# Patient Record
Sex: Male | Born: 1955 | Race: White | Hispanic: No | Smoking: Former smoker
Health system: Southern US, Community
[De-identification: ages and names within clinical notes are randomized; demographics above are authoritative.]

## PROBLEM LIST (undated history)

## (undated) DIAGNOSIS — Z9989 Dependence on other enabling machines and devices: Secondary | ICD-10-CM

## (undated) DIAGNOSIS — F419 Anxiety disorder, unspecified: Secondary | ICD-10-CM

## (undated) DIAGNOSIS — F329 Major depressive disorder, single episode, unspecified: Secondary | ICD-10-CM

## (undated) DIAGNOSIS — E785 Hyperlipidemia, unspecified: Secondary | ICD-10-CM

## (undated) DIAGNOSIS — Z860101 Personal history of adenomatous and serrated colon polyps: Secondary | ICD-10-CM

## (undated) DIAGNOSIS — G4700 Insomnia, unspecified: Secondary | ICD-10-CM

## (undated) DIAGNOSIS — N182 Chronic kidney disease, stage 2 (mild): Secondary | ICD-10-CM

## (undated) DIAGNOSIS — J449 Chronic obstructive pulmonary disease, unspecified: Secondary | ICD-10-CM

## (undated) DIAGNOSIS — G44229 Chronic tension-type headache, not intractable: Secondary | ICD-10-CM

## (undated) DIAGNOSIS — F1011 Alcohol abuse, in remission: Secondary | ICD-10-CM

## (undated) DIAGNOSIS — M1990 Unspecified osteoarthritis, unspecified site: Secondary | ICD-10-CM

## (undated) DIAGNOSIS — Z86010 Personal history of colonic polyps: Secondary | ICD-10-CM

## (undated) DIAGNOSIS — I10 Essential (primary) hypertension: Secondary | ICD-10-CM

## (undated) DIAGNOSIS — N529 Male erectile dysfunction, unspecified: Secondary | ICD-10-CM

## (undated) DIAGNOSIS — G4733 Obstructive sleep apnea (adult) (pediatric): Secondary | ICD-10-CM

## (undated) DIAGNOSIS — Z8669 Personal history of other diseases of the nervous system and sense organs: Secondary | ICD-10-CM

## (undated) DIAGNOSIS — I5032 Chronic diastolic (congestive) heart failure: Principal | ICD-10-CM

## (undated) DIAGNOSIS — D751 Secondary polycythemia: Secondary | ICD-10-CM

## (undated) HISTORY — PX: UVULECTOMY: SHX2631

## (undated) HISTORY — DX: Personal history of colonic polyps: Z86.010

## (undated) HISTORY — PX: TONSILLECTOMY/ADENOIDECTOMY/TURBINATE REDUCTION: SHX6126

## (undated) HISTORY — DX: Essential (primary) hypertension: I10

## (undated) HISTORY — DX: Chronic obstructive pulmonary disease, unspecified: J44.9

## (undated) HISTORY — DX: Male erectile dysfunction, unspecified: N52.9

## (undated) HISTORY — PX: JOINT REPLACEMENT: SHX530

## (undated) HISTORY — DX: Hyperlipidemia, unspecified: E78.5

## (undated) HISTORY — DX: Personal history of other diseases of the nervous system and sense organs: Z86.69

## (undated) HISTORY — DX: Obstructive sleep apnea (adult) (pediatric): G47.33

## (undated) HISTORY — PX: FRACTURE SURGERY: SHX138

## (undated) HISTORY — DX: Anxiety disorder, unspecified: F41.9

## (undated) HISTORY — DX: Personal history of adenomatous and serrated colon polyps: Z86.0101

## (undated) HISTORY — PX: TOE SURGERY: SHX1073

## (undated) HISTORY — DX: Alcohol abuse, in remission: F10.11

## (undated) HISTORY — DX: Chronic kidney disease, stage 2 (mild): N18.2

## (undated) HISTORY — PX: FINGER SURGERY: SHX640

## (undated) HISTORY — DX: Chronic diastolic (congestive) heart failure: I50.32

## (undated) HISTORY — DX: Unspecified osteoarthritis, unspecified site: M19.90

## (undated) HISTORY — DX: Secondary polycythemia: D75.1

## (undated) HISTORY — DX: Chronic tension-type headache, not intractable: G44.229

## (undated) HISTORY — PX: COLONOSCOPY W/ POLYPECTOMY: SHX1380

## (undated) HISTORY — DX: Dependence on other enabling machines and devices: Z99.89

---

## 1997-12-10 ENCOUNTER — Ambulatory Visit: Admission: RE | Admit: 1997-12-10 | Discharge: 1997-12-10 | Payer: Self-pay | Admitting: Otolaryngology

## 1998-06-02 ENCOUNTER — Encounter: Admission: RE | Admit: 1998-06-02 | Discharge: 1998-08-31 | Payer: Self-pay | Admitting: *Deleted

## 1999-08-05 ENCOUNTER — Encounter: Admission: RE | Admit: 1999-08-05 | Discharge: 1999-08-05 | Payer: Self-pay | Admitting: *Deleted

## 1999-11-23 ENCOUNTER — Encounter: Admission: RE | Admit: 1999-11-23 | Discharge: 2000-02-21 | Payer: Self-pay | Admitting: *Deleted

## 2000-01-10 ENCOUNTER — Ambulatory Visit (HOSPITAL_BASED_OUTPATIENT_CLINIC_OR_DEPARTMENT_OTHER): Admission: RE | Admit: 2000-01-10 | Discharge: 2000-01-10 | Payer: Self-pay | Admitting: Podiatry

## 2000-11-07 ENCOUNTER — Encounter: Admission: RE | Admit: 2000-11-07 | Discharge: 2000-11-07 | Payer: Self-pay | Admitting: Family Medicine

## 2000-11-07 ENCOUNTER — Encounter: Payer: Self-pay | Admitting: Family Medicine

## 2000-11-15 ENCOUNTER — Encounter: Admission: RE | Admit: 2000-11-15 | Discharge: 2000-11-15 | Payer: Self-pay | Admitting: *Deleted

## 2000-11-15 ENCOUNTER — Encounter: Payer: Self-pay | Admitting: *Deleted

## 2002-08-05 ENCOUNTER — Ambulatory Visit (HOSPITAL_BASED_OUTPATIENT_CLINIC_OR_DEPARTMENT_OTHER): Admission: RE | Admit: 2002-08-05 | Discharge: 2002-08-05 | Payer: Self-pay

## 2004-05-23 ENCOUNTER — Emergency Department (HOSPITAL_COMMUNITY): Admission: EM | Admit: 2004-05-23 | Discharge: 2004-05-23 | Payer: Self-pay | Admitting: Emergency Medicine

## 2004-06-20 ENCOUNTER — Ambulatory Visit (HOSPITAL_COMMUNITY): Admission: RE | Admit: 2004-06-20 | Discharge: 2004-06-20 | Payer: Self-pay | Admitting: Gastroenterology

## 2009-08-28 HISTORY — PX: ROTATOR CUFF REPAIR: SHX139

## 2012-05-06 ENCOUNTER — Ambulatory Visit (INDEPENDENT_AMBULATORY_CARE_PROVIDER_SITE_OTHER): Payer: 59 | Admitting: Family Medicine

## 2012-05-06 VITALS — BP 118/69 | HR 65 | Temp 98.0°F | Resp 18 | Ht 71.5 in | Wt 269.0 lb

## 2012-05-06 DIAGNOSIS — T148XXA Other injury of unspecified body region, initial encounter: Secondary | ICD-10-CM

## 2012-05-06 DIAGNOSIS — T07XXXA Unspecified multiple injuries, initial encounter: Secondary | ICD-10-CM

## 2012-05-06 DIAGNOSIS — L02619 Cutaneous abscess of unspecified foot: Secondary | ICD-10-CM

## 2012-05-06 DIAGNOSIS — L03039 Cellulitis of unspecified toe: Secondary | ICD-10-CM

## 2012-05-06 MED ORDER — DOXYCYCLINE HYCLATE 100 MG PO TABS: 100.0000 mg | ORAL_TABLET | Freq: Two times a day (BID) | ORAL | Status: AC

## 2012-05-06 MED ORDER — SILVER SULFADIAZINE 1 % EX CREA: TOPICAL_CREAM | Freq: Two times a day (BID) | CUTANEOUS | Status: DC

## 2012-05-06 NOTE — Progress Notes (Signed)
[facility]:  Office Visit  Chief Complaint:  Chief Complaint  Patient presents with  . Toe Pain    blisters on toes of left foot    HPI: Peter Briggs is a 56 y.o. male who complains of  Left foot with ulcers on great and 4th toes. Wears work shoes, been on hard floors, 3-4 days ago had blisters, now they "popped and hurt". Has tried OTC abx ointment on it. No improvement. Denies any h/o diabetes. No prior rashes, fevers, chills, insect bites  Past Medical History  Diagnosis Date  . Sleep apnea     on CPAP  . Hypertension   . Hyperlipidemia   . Erectile dysfunction    Past Surgical History  Procedure Date  . Finger surgery   . Toe surgery    History   Social History  . Marital Status: Married    Spouse Name: N/A    Number of Children: N/A  . Years of Education: N/A   Social History Main Topics  . Smoking status: Passive Smoker    Types: Cigars  . Smokeless tobacco: None  . Alcohol Use: Yes  . Drug Use: No  . Sexually Active: None   Other Topics Concern  . None   Social History Narrative  . None   No family history on file. No Known Allergies Prior to Admission medications   Medication Sig Start Date End Date Taking? Authorizing Provider  aspirin 81 MG tablet Take 81 mg by mouth daily.   Yes Historical Provider, MD  atenolol-chlorthalidone (TENORETIC) 100-25 MG per tablet Take 1 tablet by mouth daily.   Yes Historical Provider, MD  lisinopril (PRINIVIL,ZESTRIL) 20 MG tablet Take 20 mg by mouth daily.   Yes Historical Provider, MD  Niacin-Simvastatin [facility]) 1000-40 MG TB24 Take by mouth daily.   Yes Historical Provider, MD  tadalafil (CIALIS) 5 MG tablet Take by mouth daily as needed. Not sure of dosage.   Yes Historical Provider, MD     ROS: The patient denies fevers, chills, night sweats, unintentional weight loss, chest pain, palpitations, wheezing, dyspnea on exertion, nausea, vomiting, abdominal pain, dysuria, hematuria,  melena, numbness, weakness, or tingling.   All other systems have been reviewed and were otherwise negative with the exception of those mentioned in the HPI and as above.    PHYSICAL EXAM: Filed Vitals:   05/06/12 0924  BP: 118/69  Pulse: 65  Temp: 98 F (36.7 C)  Resp: 18   Filed Vitals:   05/06/12 0924  Height: 5' 11.5" (1.816 m)  Weight: 269 lb (122.018 kg)   Body mass index is 37.00 kg/(m^2).  General: Alert, no acute distress HEENT:  Normocephalic, atraumatic, oropharynx patent.  Cardiovascular:  Regular rate and rhythm, no rubs murmurs or gallops.  No Carotid bruits, radial pulse intact. No pedal edema.  Respiratory: Clear to auscultation bilaterally.  No wheezes, rales, or rhonchi.  No cyanosis, no use of accessory musculature GI: No organomegaly, abdomen is soft and non-tender, positive bowel sounds.  No masses. Skin: LEft foot big toe and 4th toe blisters s/p burst. Now good erytheamtous granualtion tissue. NO dc. Milderly tender. + warmth, + redness around 4th toe.  Neurologic: Facial musculature symmetric. Psychiatric: Patient is appropriate throughout our interaction. Lymphatic: No cervical lymphadenopathy Musculoskeletal: Gait intact.   LABS: No results found for this or any previous visit.   EKG/XRAY:   Primary read interpreted by Dr. Conley Rolls at [facility].   ASSESSMENT/PLAN: Encounter Diagnoses  Name  Primary?  . Cellulitis, toe Yes  . Multiple wounds    Rx Doxycycline for possible cellulitis Rx Silvidene for comfort and local antimicrobial Wound care with soap and water BID, Telfa and coban F/u in 1 week Return prn if worse Note given for work excuse 05/07/12 only    Shelisha Gautier PHUONG, DO 05/06/2012 10:03 AM

## 2012-05-28 DIAGNOSIS — F32A Depression, unspecified: Secondary | ICD-10-CM

## 2012-05-28 HISTORY — DX: Depression, unspecified: F32.A

## 2012-06-08 ENCOUNTER — Emergency Department (HOSPITAL_COMMUNITY): Payer: 59

## 2012-06-08 ENCOUNTER — Encounter (HOSPITAL_COMMUNITY): Payer: Self-pay | Admitting: *Deleted

## 2012-06-08 ENCOUNTER — Observation Stay (HOSPITAL_COMMUNITY)
Admission: EM | Admit: 2012-06-08 | Discharge: 2012-06-10 | DRG: 309 | Disposition: A | Discharge status: Home / Self Care | Payer: 59 | Attending: Internal Medicine | Admitting: Internal Medicine

## 2012-06-08 ENCOUNTER — Ambulatory Visit (INDEPENDENT_AMBULATORY_CARE_PROVIDER_SITE_OTHER): Payer: 59 | Admitting: Family Medicine

## 2012-06-08 VITALS — BP 103/70 | HR 53 | Temp 97.9°F | Resp 14 | Ht 72.5 in | Wt 259.0 lb

## 2012-06-08 DIAGNOSIS — R42 Dizziness and giddiness: Secondary | ICD-10-CM | POA: Insufficient documentation

## 2012-06-08 DIAGNOSIS — Z7982 Long term (current) use of aspirin: Secondary | ICD-10-CM | POA: Insufficient documentation

## 2012-06-08 DIAGNOSIS — E669 Obesity, unspecified: Secondary | ICD-10-CM | POA: Diagnosis present

## 2012-06-08 DIAGNOSIS — R630 Anorexia: Secondary | ICD-10-CM | POA: Insufficient documentation

## 2012-06-08 DIAGNOSIS — F419 Anxiety disorder, unspecified: Secondary | ICD-10-CM | POA: Diagnosis present

## 2012-06-08 DIAGNOSIS — N183 Chronic kidney disease, stage 3 unspecified: Secondary | ICD-10-CM | POA: Diagnosis present

## 2012-06-08 DIAGNOSIS — R209 Unspecified disturbances of skin sensation: Secondary | ICD-10-CM

## 2012-06-08 DIAGNOSIS — Z7902 Long term (current) use of antithrombotics/antiplatelets: Secondary | ICD-10-CM | POA: Insufficient documentation

## 2012-06-08 DIAGNOSIS — G4733 Obstructive sleep apnea (adult) (pediatric): Secondary | ICD-10-CM | POA: Insufficient documentation

## 2012-06-08 DIAGNOSIS — R5381 Other malaise: Secondary | ICD-10-CM | POA: Insufficient documentation

## 2012-06-08 DIAGNOSIS — R45851 Suicidal ideations: Secondary | ICD-10-CM | POA: Insufficient documentation

## 2012-06-08 DIAGNOSIS — F341 Dysthymic disorder: Secondary | ICD-10-CM | POA: Insufficient documentation

## 2012-06-08 DIAGNOSIS — R110 Nausea: Secondary | ICD-10-CM | POA: Insufficient documentation

## 2012-06-08 DIAGNOSIS — I10 Essential (primary) hypertension: Secondary | ICD-10-CM | POA: Diagnosis present

## 2012-06-08 DIAGNOSIS — R200 Anesthesia of skin: Secondary | ICD-10-CM

## 2012-06-08 DIAGNOSIS — E785 Hyperlipidemia, unspecified: Secondary | ICD-10-CM | POA: Insufficient documentation

## 2012-06-08 DIAGNOSIS — R001 Bradycardia, unspecified: Secondary | ICD-10-CM

## 2012-06-08 DIAGNOSIS — I442 Atrioventricular block, complete: Secondary | ICD-10-CM

## 2012-06-08 DIAGNOSIS — I498 Other specified cardiac arrhythmias: Secondary | ICD-10-CM

## 2012-06-08 DIAGNOSIS — I129 Hypertensive chronic kidney disease with stage 1 through stage 4 chronic kidney disease, or unspecified chronic kidney disease: Secondary | ICD-10-CM | POA: Insufficient documentation

## 2012-06-08 DIAGNOSIS — E860 Dehydration: Secondary | ICD-10-CM | POA: Insufficient documentation

## 2012-06-08 DIAGNOSIS — I959 Hypotension, unspecified: Secondary | ICD-10-CM

## 2012-06-08 DIAGNOSIS — G4700 Insomnia, unspecified: Secondary | ICD-10-CM | POA: Diagnosis present

## 2012-06-08 DIAGNOSIS — R5383 Other fatigue: Secondary | ICD-10-CM | POA: Diagnosis present

## 2012-06-08 LAB — POCT CBC
Granulocyte percent: 73.5 %G (ref 37–80)
HCT, POC: 54.5 % — AB (ref 43.5–53.7)
MID (cbc): 0.9 (ref 0–0.9)
MPV: 8.9 fL (ref 0–99.8)
POC Granulocyte: 9.2 — AB (ref 2–6.9)
POC LYMPH PERCENT: 18.9 %L (ref 10–50)
POC MID %: 7.6 %M (ref 0–12)
Platelet Count, POC: 322 10*3/uL (ref 142–424)
RDW, POC: 14.5 %

## 2012-06-08 LAB — COMPREHENSIVE METABOLIC PANEL
ALT: 20 U/L (ref 0–53)
AST: 17 U/L (ref 0–37)
Alkaline Phosphatase: 48 U/L (ref 39–117)
Creat: 1.59 mg/dL — ABNORMAL HIGH (ref 0.50–1.35)
Total Bilirubin: 0.7 mg/dL (ref 0.3–1.2)

## 2012-06-08 LAB — GLUCOSE, POCT (MANUAL RESULT ENTRY): POC Glucose: 89 mg/dl (ref 70–99)

## 2012-06-08 LAB — CBC WITH DIFFERENTIAL/PLATELET
Basophils Absolute: 0 10*3/uL (ref 0.0–0.1)
Basophils Relative: 0 % (ref 0–1)
Eosinophils Absolute: 0.5 10*3/uL (ref 0.0–0.7)
Eosinophils Relative: 4 % (ref 0–5)
HCT: 45.2 % (ref 39.0–52.0)
MCH: 32.4 pg (ref 26.0–34.0)
MCHC: 36.7 g/dL — ABNORMAL HIGH (ref 30.0–36.0)
MCV: 88.1 fL (ref 78.0–100.0)
Monocytes Absolute: 1 10*3/uL (ref 0.1–1.0)
Monocytes Relative: 9 % (ref 3–12)
Neutro Abs: 7.4 10*3/uL (ref 1.7–7.7)
RDW: 13.6 % (ref 11.5–15.5)

## 2012-06-08 LAB — POCT SEDIMENTATION RATE: POCT SED RATE: 3 mm/hr (ref 0–22)

## 2012-06-08 NOTE — ED Notes (Signed)
EKG given to Dr Aubery Lapping. No old EKG.

## 2012-06-08 NOTE — Progress Notes (Signed)
  Subjective:    Patient ID: Peter Briggs, male    DOB: 11-10-1955, 56 y.o.   MRN: [id number] Chief Complaint  Patient presents with  . bilateral arm pain/tingling/numbness    since A.M lots of stress recently rx'd ambien to help with sleep    HPI  Feeling terrible for past 1 1/2 wks.  A lot of personal stress and couldn't sleep so called dr who prescribed ambien but it didn't help so would sleep just about 1-2hrs intermittently for the next 4d. Had to call in Mon Tues, Wed and couldn't go into work due to [employer] so rx'ed [employer] which helps for a few hrs but still can't get back to sleep.  Then last night awoke w/ L arm pain and severe HA took acetaminophen, most acute pain in L thumb and both arms tingling.  Called dr who told him to stop ambien and tingling in Rt arm but dull ache in thumb to shoulder and tingling. Has had pain in it before but never done this, No neck problems, no CP, HA continues, very weak, dizzy - lightheadedness, just in just horrible. No nausea, no SHoB, no sweats,  No weakness in arms Right handed.  No f/c, tol po well, eating and drinking well. No gi/gu prob. Did take bp pills today.    Review of Systems    BP 103/70  Pulse 53  Temp(Src) 97.9 F (36.6 C) (Oral)  Resp 14  Ht 6' 0.5" (1.842 m)  Wt 259 lb (117.482 kg)  BMI 34.63 kg/m2  SpO2 98% Objective:   Physical Exam        Assessment & Plan:  Numbness and tingling in left arm - Plan: POCT CBC, POCT SEDIMENTATION RATE, POCT glucose (manual entry), Comprehensive metabolic panel, EKG 12-Lead, EKG 12-Lead  Third degree heart block  Hypotension  Bradycardia  Meds ordered this encounter  Medications  . zolpidem (AMBIEN CR) 12.5 MG CR tablet    Sig: Take 12.5 mg by mouth at bedtime as needed. For sleep

## 2012-06-08 NOTE — H&P (Signed)
PCP:   Kari Baars, MD   Chief Complaint:  Weak, tired, L arm/body pain  HPI: Patient is a 56 year old male who called me earlier in the day, or actually his mother called me earlier with a littany of issues.  These included feeling tired as he had recently started on Ambien CR for chronic insomnia, L arm pain, which he later told me was numbness in both arms, and just not feeling well.  None of his issues sounded acute, but I offered for him to be seen [facility]  He went and they found him to be bradycardic and hypotensive.  Of note he is on Atenolol/HCTZ as a BP med.  He did not note having taken more meds than he should and indicated that he had not mixed them with alcohol or other sedatives.  He was given 2 liters of fluid in the ER, his HR rose to the low 50's, not looking acute for MI (no enzymes were ordered during ED evaluation).  As he was not returning to baseline, required admission.  General:       Fatigue, tired, "doesn't feel well".   Eyes:       Denies vision loss.   Ears/Nose/Throat:       Denies decreased hearing.   Cardiovascular:       Denies chest pains, dyspnea on exertion, peripheral edema. Notes L arm numbness more than pain.  Respiratory:       Denies dyspnea.   Gastrointestinal:       Denies diarrhea, constipation, heartburn.   Genitourinary:       Complains of see HPI.        Denies urinary frequency.   Musculoskeletal:       Denies joint pain.   Skin:       Denies suspicious lesions.   Psychiatric:       Denies depression, anxiety, but "has a lot going on" and will not be specific   Endocrine:       Denies polydipsia, polyuria.   Heme/Lymphatic:       Denies bleeding.    Past History Past Medical History (reviewed - no changes required): HTN Dyslipidemia OSA on CPAP colon polyps (2005) diverticulitis Obesity chronic renal insuffeciency, Stage 3 (GFR 52) abnml lft ,mild Surgical History: toe surgery right shoulder surgery (8/12) Family  History (reviewed - no changes required): Father- deceased brain tumor, renal failure Mother- healthy, colon polyps PGF- COPD, PGM- Accident PGF- 1/2 bro- pain, fatigue ?cause; history of substance abuse 8 children- asthma in 2 Social History: separated recently, in a new relationship 8 children, 10 grandchildren (2 more on the way) 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 history of ETOH abuse, occasional cigars   Medications: Prior to Admission medications   Medication Sig Start Date End Date Taking? Authorizing Provider  aspirin 81 MG tablet Take 81 mg by mouth daily.   Yes Historical Provider, MD  atenolol-chlorthalidone (TENORETIC) 100-25 MG per tablet Take 1 tablet by mouth daily.   Yes Historical Provider, MD  lisinopril (PRINIVIL,ZESTRIL) 20 MG tablet Take 20 mg by mouth daily.   Yes Historical Provider, MD  Naphazoline-Pheniramine (EYE ALLERGY RELIEF OP) Apply 1 drop to eye daily as needed. For dry and itching eyes   Yes Historical Provider, MD  Niacin-Simvastatin [facility]) 1000-40 MG TB24 Take 1 tablet by mouth at bedtime.    Yes Historical Provider, MD  oxymetazoline (AFRIN) 0.05 % nasal spray Place 2 sprays  into the nose 2 (two) times daily.   Yes Historical Provider, MD  silver sulfADIAZINE (SILVADENE) 1 % cream Apply topically 2 (two) times daily. 05/06/12 05/06/13 Yes Thao P Le, DO  tadalafil (CIALIS) 5 MG tablet Take 5 mg by mouth daily as needed. Erectile dysfunction   Yes Historical Provider, MD  zolpidem (AMBIEN CR) 12.5 MG CR tablet Take 12.5 mg by mouth at bedtime as needed. For sleep   Yes Historical Provider, MD    Allergies:  No Known Allergies   Physical Exam: Filed Vitals:   06/08/12 2010 06/08/12 2015 06/08/12 2048 06/08/12 2100  BP:  104/48  116/60  Pulse: 50 54 56 53  Temp:      TempSrc:      Resp: 11 10 14 24   SpO2: 96% 97% 98% 97%   General appearance: alert, cooperative and appears stated age Head: Normocephalic, without  obvious abnormality, atraumatic Eyes: conjunctivae/corneas clear. PERRL, EOM's intact.  Nose: Nares normal. Septum midline. Mucosa normal. No drainage or sinus tenderness. Throat: lips, mucosa, and tongue normal; teeth and gums normal Neck: no adenopathy, no carotid bruit, no JVD and thyroid not enlarged, symmetric, no tenderness/mass/nodules Resp: clear to auscultation bilaterally Cardio: Regularly regular, but bradycardic GI: soft, non-tender; bowel sounds normal; no masses,  no organomegaly Extremities: extremities normal, atraumatic, no cyanosis or edema Pulses: 2+ and symmetric Lymph nodes: Cervical adenopathy: no cervical lymphadenopathy Neurologic: Alert and oriented X 3, normal strength and tone. Normal symmetric reflexes.     Labs on Admission:   [facility] 06/08/12 1717  NA 135  K 4.2  CL 101  CO2 24  GLUCOSE 90  BUN 37*  CREATININE 1.59*  CALCIUM 11.6*  MG --  PHOS --    Basename 06/08/12 1717  AST 17  ALT 20  ALKPHOS 48  BILITOT 0.7  PROT 7.2  ALBUMIN 4.5    Basename 06/08/12 1716  WBC 12.5*  NEUTROABS --  HGB 18.1  HCT 54.5*  MCV 96.8  PLT --    Radiological Exams on Admission: Dg Chest 2 View  06/08/2012  *RADIOLOGY REPORT*  Clinical Data: Chest pain and dizziness.  CHEST - 2 VIEW  Comparison: None.  Findings: The heart size is normal.  The lungs are clear.  The visualized soft tissues and bony thorax are unremarkable.  IMPRESSION: Negative chest.   Original Report Authenticated By: Jamesetta Orleans. MATTERN, M.D.    Orders placed during the hospital encounter of 06/08/12  . ED EKG  . ED EKG  . EKG 12-LEAD  . EKG 12-LEAD    Assessment/Plan 1) Generalized weakness-  Likely due to bradycardia, and is likely related to his Atenolol.  Will hold, keep on tele, and reevaluate in AM.  May consider echocardiogram if does not improve. 2) Dehydration/prerenal azotemia- IV fluids 3) Hypercalcemia- Will recheck post hydration and if remains high, will check  PTH and Vit D levels.  Prior level in office earlier this year was normal. 4) Insomnia- Given his recent sleepiness, I will not be resuming his Ambien until he starts to feel better 5) ? Depression- he is "under stress" as both he and his mom have said but will not specify.  Perhaps his regular primary care MD has more insight into this and he will see him on Monday. 6) Will check cardiac enzymes due to L arm pain and change in EKG, although not specific for ischemia. 7) Erythrocytosis-  Was also noted on APE labs earlier this year.  Will check post  hydration and if remains, consider workup for EPO level/Jak-2 vs related to some of his history of tobacco use and related to lung disease. 8)  Hypotension- Hold antihypertensive meds 9) Hyperlipidemia- Check Lipid panel in AM. 10) Sleep apnea-  His office chart does not contain settings and therefore cannot be ordered at this time.   Makana Feigel W 06/08/2012, 11:19 PM

## 2012-06-08 NOTE — ED Provider Notes (Signed)
History     CSN: [id number]  Arrival date & time 06/08/12  4098   First MD Initiated Contact with Patient 06/08/12 1944      Chief Complaint  Patient presents with  . Bradycardia    (Consider location/radiation/quality/duration/timing/severity/associated sxs/prior treatment) Patient is a 56 y.o. male presenting with weakness. The history is provided by the patient.  Weakness The primary symptoms include dizziness and nausea. Primary symptoms do not include headaches, syncope, loss of consciousness, altered mental status, seizures, visual change, paresthesias, focal weakness, loss of sensation, speech change, memory loss, fever or vomiting. The symptoms began 12 to 24 hours ago. The symptoms are improving. The neurological symptoms are diffuse. The symptoms occurred while sleeping.  Dizziness also occurs with nausea and weakness. Dizziness does not occur with vomiting.  Additional symptoms include weakness and pain (left arm). Associated symptoms comments: Decreased sleep for last 2 weeks, recently started on ambien. Medical issues also include hypertension (on atenolol).    Past Medical History  Diagnosis Date  . Sleep apnea     on CPAP  . Hypertension   . Hyperlipidemia   . Erectile dysfunction     Past Surgical History  Procedure Date  . Finger surgery   . Toe surgery     History reviewed. No pertinent family history.  History  Substance Use Topics  . Smoking status: Current Some Day Smoker    Types: Cigars  . Smokeless tobacco: Not on file  . Alcohol Use: Yes      Review of Systems  Constitutional: Positive for appetite change (decreased). Negative for fever.  Respiratory: Negative for cough and shortness of breath.   Cardiovascular: Negative for chest pain and syncope.  Gastrointestinal: Positive for nausea. Negative for vomiting, abdominal pain and diarrhea.  Neurological: Positive for dizziness and weakness. Negative for speech change, focal weakness,  seizures, loss of consciousness, headaches and paresthesias.  Psychiatric/Behavioral: Negative for memory loss and altered mental status.  All other systems reviewed and are negative.    Allergies  Review of patient's allergies indicates no known allergies.  Home Medications   Current Outpatient Rx  Name Route Sig Dispense Refill  . ASPIRIN 81 MG PO TABS Oral Take 81 mg by mouth daily.    . ATENOLOL-CHLORTHALIDONE 100-25 MG PO TABS Oral Take 1 tablet by mouth daily.    [facility] LISINOPRIL 20 MG PO TABS Oral Take 20 mg by mouth daily.    [facility] NIACIN-SIMVASTATIN ER 1000-40 MG PO TB24 Oral Take by mouth daily.    [facility] SILVER SULFADIAZINE 1 % EX CREA Topical Apply topically 2 (two) times daily. 50 g 0  . TADALAFIL 5 MG PO TABS Oral Take by mouth daily as needed. Not sure of dosage.    [facility] ZOLPIDEM TARTRATE ER 12.5 MG PO TBCR Oral Take 12.5 mg by mouth at bedtime as needed.      There were no vitals taken for this visit.  Physical Exam  Nursing note and vitals reviewed. Constitutional: He is oriented to person, place, and time. He appears well-developed and well-nourished. No distress.  HENT:  Head: Normocephalic and atraumatic.  Eyes: Pupils are equal, round, and reactive to light.  Cardiovascular: Normal heart sounds and intact distal pulses.  Bradycardia present.   Pulmonary/Chest: Effort normal and breath sounds normal. No respiratory distress.  Abdominal: Soft. He exhibits no distension. There is no tenderness.  Musculoskeletal: Normal range of motion.  Neurological: He is alert and oriented to person, place, and time.  Skin:  Skin is warm and dry.  Psychiatric: He has a normal mood and affect.    ED Course  Procedures (including critical care time)  Labs Reviewed  CBC WITH DIFFERENTIAL - Abnormal; Notable for the following:    WBC 11.3 (*)     MCHC 36.7 (*)     All other components within normal limits   Dg Chest 2 View  06/08/2012  *RADIOLOGY REPORT*  Clinical Data: Chest pain and  dizziness.  CHEST - 2 VIEW  Comparison: None.  Findings: The heart size is normal.  The lungs are clear.  The visualized soft tissues and bony thorax are unremarkable.  IMPRESSION: Negative chest.   Original Report Authenticated By: Jamesetta Orleans. MATTERN, M.D.     Date: 06/08/2012  Rate: 54  Rhythm: sinus bradycardia  QRS Axis: normal  Intervals: normal  ST/T Wave abnormalities: normal  Conduction Disutrbances:none  Narrative Interpretation: sinus bradycardia  Old EKG Reviewed: none available    1. Bradycardia       MDM  7:45 PM Pt seen and examined. Pt awoke overnight feeling unwell with pain going down left arm. Pt found to have sinus bradycardia on EKG and transferred here as he was hypotensive (which responded to fluids). No evidence of heart block. Concern for left arm pain being anginal equivalent. Will send troponin.  Troponin result normal. Will call [facility] for admission for ACS r/o.        Daleen Bo, MD 06/09/12 [phone]

## 2012-06-08 NOTE — ED Notes (Signed)
Pt in via EMS, per EMS- pt in from urgent care due to decreased heart rate and blood pressure. Pt was recently started on ambien and is unsure if he took an extra beta blocker. Pt c/o left arm tingling and a mild headache, denies chest pain. Denies N/V. Pt alert and oriented, skin warm and dry, no distress noted.

## 2012-06-08 NOTE — ED Notes (Signed)
Pt to radiology.

## 2012-06-09 ENCOUNTER — Encounter (HOSPITAL_COMMUNITY): Payer: Self-pay | Admitting: *Deleted

## 2012-06-09 LAB — CBC
HCT: 45.4 % (ref 39.0–52.0)
HCT: 46.4 % (ref 39.0–52.0)
Hemoglobin: 16.5 g/dL (ref 13.0–17.0)
MCV: 88.8 fL (ref 78.0–100.0)
Platelets: 214 10*3/uL (ref 150–400)
Platelets: 221 10*3/uL (ref 150–400)
RBC: 5.11 MIL/uL (ref 4.22–5.81)
RBC: 5.22 MIL/uL (ref 4.22–5.81)
RDW: 13.7 % (ref 11.5–15.5)
WBC: 11.6 10*3/uL — ABNORMAL HIGH (ref 4.0–10.5)
WBC: 11 10*3/uL — ABNORMAL HIGH (ref 4.0–10.5)

## 2012-06-09 LAB — COMPREHENSIVE METABOLIC PANEL
AST: 16 U/L (ref 0–37)
Albumin: 3.5 g/dL (ref 3.5–5.2)
Alkaline Phosphatase: 47 U/L (ref 39–117)
BUN: 29 mg/dL — ABNORMAL HIGH (ref 6–23)
CO2: 25 mEq/L (ref 19–32)
Chloride: 103 mEq/L (ref 96–112)
Potassium: 3.8 mEq/L (ref 3.5–5.1)
Total Bilirubin: 0.4 mg/dL (ref 0.3–1.2)

## 2012-06-09 LAB — LIPID PANEL
HDL: 40 mg/dL (ref >39)
LDL Cholesterol: 70 mg/dL (ref 0–99)
Triglycerides: 195 mg/dL — ABNORMAL HIGH (ref <150)
VLDL: 39 mg/dL (ref 0–40)

## 2012-06-09 LAB — CREATININE, SERUM: GFR calc Af Amer: 62 mL/min — ABNORMAL LOW (ref >90)

## 2012-06-09 MED ORDER — TEMAZEPAM 15 MG PO CAPS
15.0000 mg | ORAL_CAPSULE | Freq: Every evening | ORAL | Status: DC | PRN
  Administered 2012-06-09: 15 mg via ORAL
  Filled 2012-06-09: qty 1

## 2012-06-09 MED ORDER — ONDANSETRON HCL 4 MG/2ML IJ SOLN: 4.0000 mg | Freq: Four times a day (QID) | INTRAMUSCULAR | Status: DC | PRN

## 2012-06-09 MED ORDER — SIMVASTATIN 40 MG PO TABS
40.0000 mg | ORAL_TABLET | Freq: Every day | ORAL | Status: DC
  Administered 2012-06-09: 40 mg via ORAL
  Filled 2012-06-09 (×2): qty 1

## 2012-06-09 MED ORDER — BISMUTH SUBSALICYLATE 262 MG PO CHEW
524.0000 mg | CHEWABLE_TABLET | ORAL | Status: DC | PRN
  Administered 2012-06-09: 524 mg via ORAL
  Filled 2012-06-09 (×2): qty 2

## 2012-06-09 MED ORDER — SODIUM CHLORIDE 0.9 % IJ SOLN
3.0000 mL | Freq: Two times a day (BID) | INTRAMUSCULAR | Status: DC
  Administered 2012-06-09 – 2012-06-10 (×3): 3 mL via INTRAVENOUS

## 2012-06-09 MED ORDER — PANTOPRAZOLE SODIUM 40 MG PO TBEC
40.0000 mg | DELAYED_RELEASE_TABLET | Freq: Every day | ORAL | Status: DC
  Administered 2012-06-09 – 2012-06-10 (×2): 40 mg via ORAL
  Filled 2012-06-09: qty 1

## 2012-06-09 MED ORDER — ASPIRIN 81 MG PO CHEW
81.0000 mg | CHEWABLE_TABLET | Freq: Every day | ORAL | Status: DC
  Administered 2012-06-09 – 2012-06-10 (×2): 81 mg via ORAL
  Filled 2012-06-09 (×2): qty 1

## 2012-06-09 MED ORDER — ACETAMINOPHEN 325 MG PO TABS
650.0000 mg | ORAL_TABLET | Freq: Four times a day (QID) | ORAL | Status: DC | PRN
  Administered 2012-06-09: 650 mg via ORAL
  Filled 2012-06-09: qty 2

## 2012-06-09 MED ORDER — NIACIN 500 MG PO TABS
1000.0000 mg | ORAL_TABLET | Freq: Every day | ORAL | Status: DC
  Administered 2012-06-09: 1000 mg via ORAL
  Filled 2012-06-09 (×2): qty 2

## 2012-06-09 MED ORDER — NIACIN-SIMVASTATIN ER 1000-40 MG PO TB24: 1.0000 | ORAL_TABLET | Freq: Every day | ORAL | Status: DC

## 2012-06-09 MED ORDER — POTASSIUM CHLORIDE IN NACL 20-0.9 MEQ/L-% IV SOLN
INTRAVENOUS | Status: DC
  Administered 2012-06-09 (×3): via INTRAVENOUS
  Filled 2012-06-09 (×7): qty 1000

## 2012-06-09 MED ORDER — ACETAMINOPHEN 650 MG RE SUPP: 650.0000 mg | Freq: Four times a day (QID) | RECTAL | Status: DC | PRN

## 2012-06-09 MED ORDER — ONDANSETRON HCL 4 MG PO TABS: 4.0000 mg | ORAL_TABLET | Freq: Four times a day (QID) | ORAL | Status: DC | PRN

## 2012-06-09 MED ORDER — MAGNESIUM HYDROXIDE 400 MG/5ML PO SUSP
30.0000 mL | Freq: Every day | ORAL | Status: DC | PRN
  Administered 2012-06-09: 30 mL via ORAL
  Filled 2012-06-09: qty 30

## 2012-06-09 MED ORDER — ENOXAPARIN SODIUM 40 MG/0.4ML ~~LOC~~ SOLN
40.0000 mg | SUBCUTANEOUS | Status: DC
  Administered 2012-06-09 – 2012-06-10 (×2): 40 mg via SUBCUTANEOUS
  Filled 2012-06-09 (×3): qty 0.4

## 2012-06-09 NOTE — ED Provider Notes (Signed)
I saw and evaluated the patient, reviewed the resident's note and I agree with the findings and plan.   Rolan Bucco, MD 06/09/12 [id number]

## 2012-06-09 NOTE — Progress Notes (Signed)
Subjective: Since he came in late yesterday, he has had chest pain which is thought to be upset stomach, was given MOM, and subsequently has loose BM's this morning.  He still has his general weakness and nonspecific tired complaints.  Despite questioning he would not be specific about any particular symptoms and speaks in generalities.  Notes that he did not take any extra BP meds.  Objective: Vital signs in last 24 hours: Temp:  [97.9 F (36.6 C)-98.7 F (37.1 C)] 98.1 F (36.7 C) (10/13 0500) Pulse Rate:  [50-63] 50  (10/13 0500) Resp:  [10-24] 16  (10/13 0500) BP: (75-145)/(45-73) 145/73 mmHg (10/13 0500) SpO2:  [92 %-98 %] 97 % (10/13 0500) Weight:  [117.3 kg (258 lb 9.6 oz)-117.482 kg (259 lb)] 117.3 kg (258 lb 9.6 oz) (10/13 0043) Weight change:     Intake/Output from previous day: 10/12 0701 - 10/13 0700 In: 240 [P.O.:240] Out: 400 [Urine:400] Intake/Output this shift:   General appearance: alert, cooperative and appears stated age  Resp: clear to auscultation bilaterally  Cardio: Regularly regular, but bradycardic  GI: soft, non-tender; bowel sounds normal; no masses, no organomegaly  Extremities: extremities normal, atraumatic, no cyanosis or edema  Pulses: 2+ and symmetric  Neurologic: Alert and oriented X 3, normal strength and tone. Normal symmetric reflexes.  Psych: Somewhat flat affect.  Lab Results:  [facility] 06/09/12 0644 06/09/12 0109  WBC 11.0* 11.6*  HGB 16.7 16.5  HCT 46.4 45.4  PLT 221 214   BMET  Basename 06/09/12 0644 06/09/12 0109 06/08/12 1717  NA 138 -- 135  K 3.8 -- 4.2  CL 103 -- 101  CO2 25 -- 24  GLUCOSE 110* -- 90  BUN 29* -- 37*  CREATININE 1.45* 1.44* --  CALCIUM 9.8 -- 11.6*    Studies/Results: Dg Chest 2 View  06/08/2012  *RADIOLOGY REPORT*  Clinical Data: Chest pain and dizziness.  CHEST - 2 VIEW  Comparison: None.  Findings: The heart size is normal.  The lungs are clear.  The visualized soft tissues and bony thorax are  unremarkable.  IMPRESSION: Negative chest.   Original Report Authenticated By: Jamesetta Orleans. MATTERN, M.D.     Medications:  I have reviewed the patient's current medications. Scheduled:   . aspirin  81 mg Oral Daily  . enoxaparin (LOVENOX) injection  40 mg Subcutaneous Q24H  . niacin  1,000 mg Oral QHS  . pantoprazole  40 mg Oral Q1200  . simvastatin  40 mg Oral q1800  . sodium chloride  3 mL Intravenous Q12H  . DISCONTD: Niacin-Simvastatin  1 tablet Oral QHS   Continuous:   . 0.9 % NaCl with KCl 20 mEq / L 100 mL/hr at 06/09/12 0030   ZOX:[id number], acetaminophen, bismuth subsalicylate, magnesium hydroxide, ondansetron (ZOFRAN) IV, ondansetron, temazepam  Assessment/Plan: 1) Generalized weakness- Likely due to bradycardia, and is likely related to his Atenolol.  Continue to hold BP meds. 2) Dehydration/prerenal azotemia- IV fluids improving 3) Hypercalcemia- Normalized with hydration 4) Insomnia- Given his recent sleepiness, I will not be resuming his Ambien until he starts to feel better.  Low dose Temazepam only  5) ? Depression- he is "under stress" as both he and his mom have said but will not specify. Perhaps his regular primary care MD has more insight into this and he will see him on Monday. May wish to consider a psych eval. 6) Ccardiac enzymes negative.  Pain non ischemic 7) Erythrocytosis- Resolved with hydration overnight. 8) Hypotension- Hold antihypertensive meds  9) Hyperlipidemia-  Lipid panel in AM pending.  10) Sleep apnea- His office chart does not contain settings and therefore cannot be ordered at this time. 11) Diarrhea/GI issues.  Now on Protonix with Pepto Bismol prn   LOS: 1 day   Eastyn Skalla W 06/09/2012, 8:30 AM

## 2012-06-10 DIAGNOSIS — F419 Anxiety disorder, unspecified: Secondary | ICD-10-CM | POA: Diagnosis present

## 2012-06-10 DIAGNOSIS — R5383 Other fatigue: Secondary | ICD-10-CM | POA: Diagnosis present

## 2012-06-10 DIAGNOSIS — E669 Obesity, unspecified: Secondary | ICD-10-CM | POA: Diagnosis present

## 2012-06-10 DIAGNOSIS — F32A Depression, unspecified: Secondary | ICD-10-CM | POA: Diagnosis present

## 2012-06-10 DIAGNOSIS — R001 Bradycardia, unspecified: Secondary | ICD-10-CM | POA: Diagnosis present

## 2012-06-10 DIAGNOSIS — I10 Essential (primary) hypertension: Secondary | ICD-10-CM | POA: Diagnosis present

## 2012-06-10 DIAGNOSIS — G4700 Insomnia, unspecified: Secondary | ICD-10-CM | POA: Diagnosis present

## 2012-06-10 LAB — COMPREHENSIVE METABOLIC PANEL
AST: 18 U/L (ref 0–37)
Albumin: 3.4 g/dL — ABNORMAL LOW (ref 3.5–5.2)
Calcium: 8.9 mg/dL (ref 8.4–10.5)
Creatinine, Ser: 1.47 mg/dL — ABNORMAL HIGH (ref 0.50–1.35)
GFR calc non Af Amer: 52 mL/min — ABNORMAL LOW (ref >90)

## 2012-06-10 LAB — CBC
MCH: 32.2 pg (ref 26.0–34.0)
MCHC: 36.2 g/dL — ABNORMAL HIGH (ref 30.0–36.0)
MCV: 88.8 fL (ref 78.0–100.0)
Platelets: 175 10*3/uL (ref 150–400)
RDW: 13.4 % (ref 11.5–15.5)

## 2012-06-10 MED ORDER — BUPROPION HCL ER (XL) 150 MG PO TB24: 150.0000 mg | ORAL_TABLET | Freq: Every day | ORAL | Status: DC

## 2012-06-10 MED ORDER — ESZOPICLONE 3 MG PO TABS: 3.0000 mg | ORAL_TABLET | Freq: Every day | ORAL | Status: DC

## 2012-06-10 NOTE — Progress Notes (Signed)
Spoke with Dr. Ferol Luz about plan, suggested ok for D/C, start Lunesta 3mg  qHS, and Wellbutrin XL 150mg  qHS. Notified Dr. Clelia Croft. Plan to D/C home. Emelda Brothers RN

## 2012-06-10 NOTE — Consult Note (Signed)
Patient Identification:  Peter Briggs Date of Evaluation:  06/10/2012 Reason for Consult: Suicidal ideation, Anxiety, Depression  Referring Provider: Dr. 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  He says the work has been overwhelming: too many rules and regulations to comprehend.  He is not specific about the specific challenge: reading, comprehension, memory??  He has been through a divorce, finally finalized.  He had a GF, just broke up with her due to conflicts.  He moved in with his mother; asked her to hide his guns and ammo.  He has tried to take Ambien for insomnia and Obstructive Sleep Apnea.  It has not helped him.  He ha had thoughts of suicide:  Use his CPap with Freon; take a pistol to his temple.   Past Psychiatric History:He does not admit to prior contact.   Past Medical History:     Past Medical History  Diagnosis Date  . Sleep apnea     on CPAP  . Hypertension   . Hyperlipidemia   . Erectile dysfunction        Past Surgical History  Procedure Date  . Finger surgery   . Toe surgery     Allergies: No Known Allergies  Current Medications:  Prior to Admission medications   Medication Sig Start Date End Date Taking? Authorizing Provider  aspirin 81 MG tablet Take 81 mg by mouth daily.   Yes Historical Provider, MD  atenolol-chlorthalidone (TENORETIC) 100-25 MG per tablet Take 1 tablet by mouth daily.   Yes Historical Provider, MD  lisinopril (PRINIVIL,ZESTRIL) 20 MG tablet Take 20 mg by mouth daily.   Yes Historical Provider, MD  Naphazoline-Pheniramine (EYE ALLERGY RELIEF OP) Apply 1 drop to eye daily as needed. For dry and itching eyes   Yes Historical Provider, MD  Niacin-Simvastatin [facility]) 1000-40 MG TB24 Take 1 tablet by mouth at bedtime.    Yes Historical Provider, MD  oxymetazoline (AFRIN) 0.05 % nasal spray Place 2 sprays into  the nose 2 (two) times daily.   Yes Historical Provider, MD  silver sulfADIAZINE (SILVADENE) 1 % cream Apply topically 2 (two) times daily. 05/06/12 05/06/13 Yes Thao P Le, DO  tadalafil (CIALIS) 5 MG tablet Take 5 mg by mouth daily as needed. Erectile dysfunction   Yes Historical Provider, MD  zolpidem (AMBIEN CR) 12.5 MG CR tablet Take 12.5 mg by mouth at bedtime as needed. For sleep   Yes Historical Provider, MD    Social History:    reports that he has been smoking Cigars.  He does not have any smokeless tobacco history on file. He reports that he drinks alcohol. He reports that he does not use illicit drugs.   Family History:    History reviewed. No pertinent family history.  Mental Status Examination/Evaluation: Objective:  Appearance: Casual, Fairly Groomed and obese  Psychomotor Activity:  Normal  Eye Contact::  Good  Speech:  Clear and Coherent, Normal Rate and Is  Volume:  Normal  Mood:  Depressed and Hopeless  Affect:  Appropriate, Congruent and Depressed  Thought Process:  Coherent, Relevant and Intact  Orientation:  Full  Thought Content:  Suicidal ideation  Suicidal Thoughts:  No  Homicidal Thoughts:  No  Judgement:  Impaired  Insight:  Fair    DIAGNOSIS:   AXIS I    depression due to environmental stress and relationships.  AXIS II  Deffered  AXIS III See medical notes.  AXIS IV economic problems, occupational problems, other psychosocial or environmental problems, problems related to social environment and problems with processing technical information  AXIS V 51-60 moderate symptoms   Assessment/Plan:  Mr. 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 The specific challenge is not defined so it may have many dimensions. However this in combination with the recent finalization of a divorce and more recent breakup with a girlfriend has caused him to feel  frustrated and hopeless. He has thought of several very lethal means of suicide.   In addition, he has turned over his guns and ammunition to his mother and ask her to hide them. He states with her since he has been so depressed. He says he would not end his life now but he is seriously thinking of looking for a different job. That also causes him to feel discouraged.   he has obstructive sleep apnea which can also, by oxygen deprivation, add to depressed mood and poor concentration.   Based upon the severity of his suicidal ideation, although not active at this moment, it is indicative of the severity of his depression and distress.  RECOMMENDATION:    1. This patient has capacity to understand his situation and need for treatment. 2.  It is recommended that this patient be referred to inpatient psychiatric care to enable him to focus and verbalize the difficulty he is having in addition to assist him in identifying alternative coping mechanisms. 3. Suggest substitution of Ambien with Lunesta 3 mg at bedtime  4. Suggest Wellbutrin XL 150 mg in a.m. Daily 5. No further psychiatric needs unless requested.  M.D. Psychiatrist signs off  Mayling Aber J. Ferol Luz, MD Psychiatrist  06/10/2012 12:40 PM

## 2012-06-10 NOTE — Discharge Summary (Signed)
DISCHARGE SUMMARY  Peter Briggs  MR#: [id number]  DOB:05-May-1956  Date of Admission: 06/08/2012 Date of Discharge: 06/10/2012  Attending Physician:SHAW,W DOUGLAS  Patient's ZHY:QMVH,Q DOUGLAS, MD  Consults: Dr. Ferol Luz- Psychiatry  Discharge Diagnoses: Principal Problem:  *Symptomatic bradycardia Active Problems:  Fatigue  Anxiety and depression  Insomnia  Hypertension  Obesity  Chronic kidney disease, stage III (moderate)  Past Medical History:  HTN  Dyslipidemia  OSA on CPAP  colon polyps (2005)  diverticulitis  Obesity  chronic renal insuffeciency, Stage 3 (GFR 52)  abnml lft ,mild   Past Surgical History: toe surgery  right shoulder surgery (8/12)   Discharge Medications:   Medication List     As of 06/10/2012  5:25 PM    STOP taking these medications         atenolol-chlorthalidone 100-25 MG per tablet   Commonly known as: TENORETIC      oxymetazoline 0.05 % nasal spray   Commonly known as: AFRIN      zolpidem 12.5 MG CR tablet   Commonly known as: AMBIEN CR      TAKE these medications         aspirin 81 MG tablet   Take 81 mg by mouth daily.      buPROPion 150 MG 24 hr tablet   Commonly known as: WELLBUTRIN XL   Take 1 tablet (150 mg total) by mouth daily.      Eszopiclone 3 MG Tabs   Take 1 tablet (3 mg total) by mouth at bedtime. Take immediately before bedtime      EYE ALLERGY RELIEF OP   Apply 1 drop to eye daily as needed. For dry and itching eyes      lisinopril 20 MG tablet   Commonly known as: PRINIVIL,ZESTRIL   Take 20 mg by mouth daily.      silver sulfADIAZINE 1 % cream   Commonly known as: SILVADENE   Apply topically 2 (two) times daily.      SIMCOR 1000-40 MG Tb24   Generic drug: Niacin-Simvastatin   Take 1 tablet by mouth at bedtime.      tadalafil 5 MG tablet   Commonly known as: CIALIS   Take 5 mg by mouth daily as needed. Erectile dysfunction        Hospital Procedures: Dg Chest 2  View  06/08/2012  *RADIOLOGY REPORT*  Clinical Data: Chest pain and dizziness.  CHEST - 2 VIEW  Comparison: None.  Findings: The heart size is normal.  The lungs are clear.  The visualized soft tissues and bony thorax are unremarkable.  IMPRESSION: Negative chest.   Original Report Authenticated By: Jamesetta Orleans. MATTERN, M.D.     History of Present Illness: Patient is a 56 year old male who called me earlier in the day, or actually his mother called me earlier with a littany of issues. These included feeling tired as he had recently started on Ambien CR for chronic insomnia, L arm pain, which he later told me was numbness in both arms, and just not feeling well. None of his issues sounded acute, but I offered for him to be seen at Urgent Care.Marland Kitchen He went and they found him to be bradycardic and hypotensive. Of note he is on Atenolol/HCTZ as a BP med. He did not note having taken more meds than he should and indicated that he had not mixed them with alcohol or other sedatives. He was given 2 liters of fluid in the ER, his HR rose to  the low 50's, not looking acute for MI (no enzymes were ordered during ED evaluation). As he was not returning to baseline, required admission.   Hospital Course: Peter Briggs was admitted to a telemetry bed.  His antihypertensive medications were held on admission due to bradycardia with his beta blocker and relative hypotension.  He was hydrated for mild dehydration.  With this, his heart rate trended up into the 60s at rest with an appropriate increase with ambulation.  His blood pressure normalized prior to discharge. Of note, the patient has lost about 60 pounds in the last year, which has likely contributed to his change in blood pressure and heart rate.  The patient continued to complain of severe situational stress at home related to family, work, and financial stressors.  He endorsed recent suicidal ideations and states that he had considered shooting himself.  He has  removed all firearms from the home and has actually been staying with his mother for his safety.  Given the sentiments psychiatry was consulted and recommended starting Wellbutrin XL 150 mg daily as well as Lunesta for insomnia.  They did not feel that he would require inpatient psychiatric hospitalization and felt that he was safe for discharge home.  The patient denied that he had taken any extra medications or attempted to harm himself with his medications.  Day of Discharge Exam BP 130/83  Pulse 66  Temp 98 F (36.7 C) (Oral)  Resp 18  Ht 6' (1.829 m)  Wt 117.3 kg (258 lb 9.6 oz)  BMI 35.07 kg/m2  SpO2 96%  Physical Exam: General appearance: no distress and flat affect  Eyes: no scleral icterus  Throat: oropharynx moist without erythema  Resp: clear to auscultation bilaterally  Cardio: bradycardic without murmurs, rub, gallops  GI: soft, non-tender; bowel sounds normal; no masses, no organomegaly  Extremities: no clubbing, cyanosis or edema  Psych: flat affect, appropriate, no apparent delusions   Discharge Labs:  [facility] 06/10/12 0552 06/09/12 0644  NA 137 138  K 4.0 3.8  CL 102 103  CO2 26 25  GLUCOSE 92 110*  BUN 22 29*  CREATININE 1.47* 1.45*  CALCIUM 8.9 9.8  MG -- --  PHOS -- --    Basename 06/10/12 0552 06/09/12 0644  AST 18 16  ALT 15 18  ALKPHOS 44 47  BILITOT 0.4 0.4  PROT 6.3 6.5  ALBUMIN 3.4* 3.5    Basename 06/10/12 0552 06/09/12 0644 06/08/12 1947  WBC 9.4 11.0* --  NEUTROABS -- -- 7.4  HGB 16.6 16.7 --  HCT 45.8 46.4 --  MCV 88.8 88.9 --  PLT 175 221 --   No results found for this basename: INR, PROTIME    Basename 06/09/12 0644 06/09/12 0108  CKTOTAL -- --  CKMB -- --  CKMBINDEX -- --  TROPONINI <0.30 <0.30    Basename 06/09/12 0109  TSH 1.534  T4TOTAL --  T3FREE --  THYROIDAB --   No results found for this basename: VITAMINB12:2,FOLATE:2,FERRITIN:2,TIBC:2,IRON:2,RETICCTPCT:2 in the last 72 hours  Discharge instructions:      Discharge Orders    Future Orders Please Complete By Expires   Diet - low sodium heart healthy      Increase activity slowly      Comments:   Remain out of work for the next 10 days until you are seen back in the office.   Discharge instructions      Comments:   Go to the emergency room at [facility] immediately if  you have any suicidal thoughts.  Do not take atenolol, HCTZ, or Ambien.  Can use Lunesta as an alternative for sleep. Recommend Omron blood pressure cuff. Monitor blood pressure at home and call if running below 100/50 or above 160/90.  Call if heart rate below 50.      Disposition: to home  Follow-up Appts: Follow-up with Dr. Clelia Croft at [facility] in 1 week.  Call for appointment.  Condition on Discharge: improved/stable  Tests Needing Follow-up: none  Time with discharge activities: 45 min.  Signed: SHAW,W DOUGLAS 06/10/2012, 5:25 PM

## 2012-06-10 NOTE — Progress Notes (Signed)
Subjective: He feels a little better.  Diarrhea yesterday, none yet today.  Endorses extreme anxiety/stress over the past 2 weeks- home (divorced, 8 children, 11 grandchildren), work, Surveyor, quantity stressors discussed in Statistician.  Unable to sleep.  Has had suicidal thoughts- reports that he would shoot himself.  Had his mother remove all firearms from the home and hide them and has been living with her this week due to the suicidal thoughts.  He denies suicidal ideation currently.  Missed work 3 days last week due to extreme stress and inability to cope.  Objective: Vital signs in last 24 hours: Temp:  [97.3 F (36.3 C)-98.2 F (36.8 C)] 97.3 F (36.3 C) (10/14 0600) Pulse Rate:  [52-95] 52  (10/14 0600) Resp:  [15-20] 18  (10/14 0600) BP: (105-127)/(53-79) 127/79 mmHg (10/14 0600) SpO2:  [98 %-100 %] 100 % (10/14 0600) Weight change:  Last BM Date: 06/09/12  CBG (last 3)  No results found for this basename: GLUCAP:3 in the last 72 hours  Intake/Output from previous day: 10/13 0701 - 10/14 0700 In: 3854 [P.O.:804; I.V.:3050] Out: -  Intake/Output this shift:    General appearance: no distress and flat affect Eyes: no scleral icterus Throat: oropharynx moist without erythema Resp: clear to auscultation bilaterally Cardio: bradycardic without murmurs, rub, gallops GI: soft, non-tender; bowel sounds normal; no masses,  no organomegaly Extremities: no clubbing, cyanosis or edema Psych: flat affect, appropriate, no apparent delusions   Lab Results:  [facility] 06/10/12 0552 06/09/12 0644  NA 137 138  K 4.0 3.8  CL 102 103  CO2 26 25  GLUCOSE 92 110*  BUN 22 29*  CREATININE 1.47* 1.45*  CALCIUM 8.9 9.8  MG -- --  PHOS -- --    Basename 06/10/12 0552 06/09/12 0644  AST 18 16  ALT 15 18  ALKPHOS 44 47  BILITOT 0.4 0.4  PROT 6.3 6.5  ALBUMIN 3.4* 3.5    Basename 06/10/12 0552 06/09/12 0644 06/08/12 1947  WBC 9.4 11.0* --  NEUTROABS -- -- 7.4  HGB 16.6 16.7 --    HCT 45.8 46.4 --  MCV 88.8 88.9 --  PLT 175 221 --   No results found for this basename: INR, PROTIME    Basename 06/09/12 0644 06/09/12 0108  CKTOTAL -- --  CKMB -- --  CKMBINDEX -- --  TROPONINI <0.30 <0.30    Basename 06/09/12 0109  TSH 1.534  T4TOTAL --  T3FREE --  THYROIDAB --   No results found for this basename: VITAMINB12:2,FOLATE:2,FERRITIN:2,TIBC:2,IRON:2,RETICCTPCT:2 in the last 72 hours  Studies/Results: Dg Chest 2 View  06/08/2012  *RADIOLOGY REPORT*  Clinical Data: Chest pain and dizziness.  CHEST - 2 VIEW  Comparison: None.  Findings: The heart size is normal.  The lungs are clear.  The visualized soft tissues and bony thorax are unremarkable.  IMPRESSION: Negative chest.   Original Report Authenticated By: Jamesetta Orleans. MATTERN, M.D.      Medications: Scheduled:   . aspirin  81 mg Oral Daily  . enoxaparin (LOVENOX) injection  40 mg Subcutaneous Q24H  . niacin  1,000 mg Oral QHS  . pantoprazole  40 mg Oral Q1200  . simvastatin  40 mg Oral q1800  . sodium chloride  3 mL Intravenous Q12H   Continuous:   . 0.9 % NaCl with KCl 20 mEq / L 100 mL/hr at 06/09/12 2126    Assessment/Plan: Principal Problem: 1. *Symptomatic bradycardia- slightly better off Bblocker.  Will check HR with ambulation.  Stay off Atenolol.  Active Problems: 2. Fatigue- multifactorial secondary to bradycardia, relative hypotenision, and depression.   3. Anxiety and depression- he endorses recent suicidal ideation, none currently.  He denies that he took extra pills or substances prior to this hospitalization.  Will consult psychiatry prior to discharge to ensure patient safety and adequate follow-up.  Likely needs treatment- will defer to psychiatry. 4. Insomnia- continue Restoril.  No ambien due to fatigue. 5. Hypertension- BP was low on admission and has remained OK despite being off Lisinopril, HCTZ, and Atenolol.  WIll continue to monitor. 6. Obesity- he has lost 60 lbs in  past year which has likely contributed to lower BP and bradycardia with medications. 7. CRI, Stage 3- at baseline. 8. Disposition- stable for discharge from medical standpoint once evaluated/cleared by psychiatry.  Will follow-up within 1 week.  Will need to stay out of work for 1 week while things stabilize.    LOS: 2 days   Peter Briggs,W DOUGLAS 06/10/2012, 7:35 AM

## 2012-06-10 NOTE — Progress Notes (Signed)
Placed pt. On cpap as per order. Pt. On 10cmh20 with nasal mask. Pt. Is tolerating well.

## 2012-06-10 NOTE — Care Management Note (Signed)
    Page 1 of 1   06/10/2012     3:09:53 PM   CARE MANAGEMENT NOTE 06/10/2012  Patient:  Peter Briggs, Peter Briggs   Account Number:  [id number]  Date Initiated:  06/10/2012  Documentation initiated by:  GRAVES-BIGELOW,Azha Constantin  Subjective/Objective Assessment:   Pt admitted with dizziness and  weakness. CP ruled out and plan for psych to evaluate and possible home today.     Action/Plan:   No needs from CM at this time.   Anticipated DC Date:  06/10/2012   Anticipated DC Plan:  HOME/SELF CARE      DC Planning Services  CM consult      Choice offered to / List presented to:             Status of service:  Completed, signed off Medicare Important Message given?   (If response is "NO", the following Medicare IM given date fields will be blank) Date Medicare IM given:   Date Additional Medicare IM given:    Discharge Disposition:  HOME/SELF CARE  Per UR Regulation:  Reviewed for med. necessity/level of care/duration of stay  If discussed at Long Length of Stay Meetings, dates discussed:    Comments:

## 2012-06-16 ENCOUNTER — Emergency Department (HOSPITAL_COMMUNITY): Payer: 59

## 2012-06-16 ENCOUNTER — Emergency Department (HOSPITAL_COMMUNITY)
Admission: EM | Admit: 2012-06-16 | Discharge: 2012-06-18 | Disposition: A | Discharge status: Home / Self Care | Payer: 59 | Attending: Emergency Medicine | Admitting: Emergency Medicine

## 2012-06-16 ENCOUNTER — Encounter (HOSPITAL_COMMUNITY): Payer: Self-pay | Admitting: *Deleted

## 2012-06-16 DIAGNOSIS — N529 Male erectile dysfunction, unspecified: Secondary | ICD-10-CM | POA: Insufficient documentation

## 2012-06-16 DIAGNOSIS — E785 Hyperlipidemia, unspecified: Secondary | ICD-10-CM | POA: Insufficient documentation

## 2012-06-16 DIAGNOSIS — G4730 Sleep apnea, unspecified: Secondary | ICD-10-CM | POA: Insufficient documentation

## 2012-06-16 DIAGNOSIS — F329 Major depressive disorder, single episode, unspecified: Secondary | ICD-10-CM | POA: Insufficient documentation

## 2012-06-16 DIAGNOSIS — F32A Depression, unspecified: Secondary | ICD-10-CM

## 2012-06-16 DIAGNOSIS — Z79899 Other long term (current) drug therapy: Secondary | ICD-10-CM | POA: Insufficient documentation

## 2012-06-16 DIAGNOSIS — T1491XA Suicide attempt, initial encounter: Secondary | ICD-10-CM

## 2012-06-16 DIAGNOSIS — F489 Nonpsychotic mental disorder, unspecified: Secondary | ICD-10-CM | POA: Insufficient documentation

## 2012-06-16 DIAGNOSIS — I10 Essential (primary) hypertension: Secondary | ICD-10-CM | POA: Insufficient documentation

## 2012-06-16 DIAGNOSIS — G4700 Insomnia, unspecified: Secondary | ICD-10-CM | POA: Insufficient documentation

## 2012-06-16 DIAGNOSIS — F17200 Nicotine dependence, unspecified, uncomplicated: Secondary | ICD-10-CM | POA: Insufficient documentation

## 2012-06-16 DIAGNOSIS — F3289 Other specified depressive episodes: Secondary | ICD-10-CM | POA: Insufficient documentation

## 2012-06-16 DIAGNOSIS — Z7982 Long term (current) use of aspirin: Secondary | ICD-10-CM | POA: Insufficient documentation

## 2012-06-16 HISTORY — DX: Insomnia, unspecified: G47.00

## 2012-06-16 LAB — COMPREHENSIVE METABOLIC PANEL
ALT: 26 U/L (ref 0–53)
Alkaline Phosphatase: 60 U/L (ref 39–117)
BUN: 21 mg/dL (ref 6–23)
CO2: 25 mEq/L (ref 19–32)
GFR calc Af Amer: 62 mL/min — ABNORMAL LOW (ref >90)
GFR calc non Af Amer: 54 mL/min — ABNORMAL LOW (ref >90)
Glucose, Bld: 92 mg/dL (ref 70–99)
Potassium: 3.8 mEq/L (ref 3.5–5.1)
Sodium: 137 mEq/L (ref 135–145)
Total Bilirubin: 0.5 mg/dL (ref 0.3–1.2)

## 2012-06-16 LAB — RAPID URINE DRUG SCREEN, HOSP PERFORMED
Amphetamines: NOT DETECTED
Barbiturates: NOT DETECTED
Tetrahydrocannabinol: NOT DETECTED

## 2012-06-16 LAB — GLUCOSE, CAPILLARY: Glucose-Capillary: 97 mg/dL (ref 70–99)

## 2012-06-16 LAB — CBC
HCT: 51.5 % (ref 39.0–52.0)
Hemoglobin: 19 g/dL — ABNORMAL HIGH (ref 13.0–17.0)
MCHC: 36.9 g/dL — ABNORMAL HIGH (ref 30.0–36.0)
RBC: 5.93 MIL/uL — ABNORMAL HIGH (ref 4.22–5.81)

## 2012-06-16 LAB — ETHANOL: Alcohol, Ethyl (B): <11 mg/dL (ref 0–11)

## 2012-06-16 MED ORDER — IBUPROFEN 200 MG PO TABS: 400.0000 mg | ORAL_TABLET | Freq: Four times a day (QID) | ORAL | Status: DC | PRN

## 2012-06-16 NOTE — ED Notes (Signed)
Pt's mom at bedside, she reports that the pt has not slept in 2 weeks.

## 2012-06-16 NOTE — ED Notes (Signed)
Pt's valuables (wallet) were signed out by security to pt's mother.  Handoff was done in front of patient.

## 2012-06-16 NOTE — ED Notes (Signed)
Nicloe, RN [facility] aware of need for sitter

## 2012-06-16 NOTE — ED Notes (Signed)
[name] aware of mom's request and concerns

## 2012-06-16 NOTE — ED Notes (Signed)
Spoke with Poison Control to followed up.  Since Pt is stable, they are "closing him out."  Informed to call it condition changes.

## 2012-06-16 NOTE — ED Notes (Signed)
Tele-psych paperwork faxed and SOC called.  Informed that it could be an hour to 1.5 hrs.

## 2012-06-16 NOTE — ED Provider Notes (Signed)
History     CSN: [id number]  Arrival date & time 06/16/12  1201   First MD Initiated Contact with Patient 06/16/12 1202      Chief Complaint  Patient presents with  . Suicide Attempt    (Consider location/radiation/quality/duration/timing/severity/associated sxs/prior treatment) HPI This 56 year old male has a history of sleep apnea and uses CPAP, he states that 2 weeks ago he broke with his girlfriend has been anxious depressed and having insomnia since that time, today he wanted to kill himself so he has been staying at his mom's house and he went outside with a CPAP machine and inhaled Freon, he walked back inside the house and his mom caught him with a CPAP machine and asked what he was doing, when the patient admitted he was trying to kill himself and wanted I the mom called EMS the patient is brought to the ED. The patient would not have called EMS himself and does not want to be here. Patient would rather be dead. Patient denies any homicidal ideation denies any hallucinations. He is suicidal ideation and depression. He denies chest pain cough shortness breath abdominal pain vomiting confusion or other concerns. Past Medical History  Diagnosis Date  . Sleep apnea     on CPAP  . Hypertension   . Hyperlipidemia   . Erectile dysfunction   . Insomnia     Past Surgical History  Procedure Date  . Finger surgery   . Toe surgery     History reviewed. No pertinent family history.  History  Substance Use Topics  . Smoking status: Current Some Day Smoker    Types: Cigars  . Smokeless tobacco: Not on file  . Alcohol Use: Yes      Review of Systems 10 Systems reviewed and are negative for acute change except as noted in the HPI. Allergies  Review of patient's allergies indicates no known allergies.  Home Medications   Current Outpatient Rx  Name Route Sig Dispense Refill  . AMITRIPTYLINE HCL 25 MG PO TABS Oral Take 25 mg by mouth at bedtime.    . ASPIRIN 81 MG PO  TABS Oral Take 81 mg by mouth daily.    . BUPROPION HCL ER (XL) 150 MG PO TB24 Oral Take 1 tablet (150 mg total) by mouth daily. 30 tablet 1  . ESZOPICLONE 3 MG PO TABS Oral Take 1 tablet (3 mg total) by mouth at bedtime. Take immediately before bedtime 30 tablet 1  . NIACIN-SIMVASTATIN ER 1000-40 MG PO TB24 Oral Take 1 tablet by mouth at bedtime.     Marland Kitchen TADALAFIL 5 MG PO TABS Oral Take 5 mg by mouth daily as needed. Erectile dysfunction      BP 145/98  Pulse 76  Resp 14  SpO2 98%  Physical Exam  Nursing note and vitals reviewed. Constitutional:       Awake, alert, nontoxic appearance.  HENT:  Head: Atraumatic.  Eyes: Right eye exhibits no discharge. Left eye exhibits no discharge.  Neck: Neck supple.  Cardiovascular: Normal rate and regular rhythm.   No murmur heard. Pulmonary/Chest: Effort normal and breath sounds normal. No respiratory distress. He has no wheezes. He has no rales. He exhibits no tenderness.  Abdominal: Soft. There is no tenderness. There is no rebound.  Musculoskeletal: He exhibits no tenderness.       Baseline ROM, no obvious new focal weakness.  Neurological: He is alert.       Mental status and motor strength appears baseline for patient  and situation.  Skin: No rash noted.  Psychiatric:       Flat affect and depressed, cooperative upon arrival    ED Course  Procedures (including critical care time) ECG: Sinus rhythm, ventricular rate 74, right axis deviation, no acute ischemic changes noted, no significant change noted compared with 06/09/2012  Will make Pt IVC since still SI and Pt has no interest in hospital assistance to get better.1225  D/w ACT; Telepsych consult pnd.  Care endorsed to Dr. Ranae Palms.  Dispo pnd.  Pt IVC. 1530 Pt stable in ED with no significant deterioration in condition.1530 Labs Reviewed  CBC - Abnormal; Notable for the following:    RBC 5.93 (*)     Hemoglobin 19.0 (*)     MCHC 36.9 (*)     All other components within normal  limits  COMPREHENSIVE METABOLIC PANEL - Abnormal; Notable for the following:    Creatinine, Ser 1.43 (*)     Calcium 11.1 (*)     GFR calc non Af Amer 54 (*)     GFR calc Af Amer 62 (*)     All other components within normal limits  SALICYLATE LEVEL - Abnormal; Notable for the following:    Salicylate Lvl <2.0 (*)     All other components within normal limits  ETHANOL  ACETAMINOPHEN LEVEL  URINE RAPID DRUG SCREEN (HOSP PERFORMED)  GLUCOSE, CAPILLARY   Dg Chest Port 1 View  06/16/2012  *RADIOLOGY REPORT*  Clinical Data: Overdose inhalation  PORTABLE CHEST - 1 VIEW  Comparison: None.  Findings: Cardiomediastinal silhouette is stable.  No acute infiltrate or pleural effusion.  No pulmonary edema.  Mild basilar atelectasis.  IMPRESSION: No acute infiltrate or pulmonary edema.  Mild basilar atelectasis.   Original Report Authenticated By: Natasha Mead, M.D.      1. Suicide attempt   2. Depression       MDM  Dispo pnd.        Hurman Horn, MD 06/21/12 2100

## 2012-06-16 NOTE — ED Notes (Signed)
Per Pt he has been staying with his mom for 2 weeks because he broke up with his girlfriend and he has been unable to sleep. Mom saw him come in the house with  His cpap and she asked what he was doing. Pt hooked cpap up to freon R-22  To try to kill himself. Pt mom called 911.

## 2012-06-16 NOTE — ED Notes (Signed)
Pt changed into blue scrubs

## 2012-06-16 NOTE — ED Notes (Signed)
Attempted to call report to psych ed.  Wille Celeste, RN to call me back.

## 2012-06-16 NOTE — ED Provider Notes (Signed)
Pt seen by Dr Trisha Mangle and told him that he had thought about suicide but that now he regretted doing so. I personally examined the pt and asked him what happened. Pt stated that he has been planning to kill himself for several days and was actually attempting suicide today when brought to the ED. He was on no mind altering substances. Here in the ED, pt told MD he was actively suicidal. When I asked the patient if he still wanted to kill himself he would not make eye contact and gave a delayed shake of the head. I asked him what changed since presenting here a few hours earlier. Stated he was thinking clearly. I believe the pt is still at high risk to harm himself and will uphold the IVC. I have asked [facility] to see pt and will have the pt seen by in-house psychiatrist tomorrow.   Loren Racer, MD 06/16/12 2233

## 2012-06-16 NOTE — ED Notes (Signed)
Pt ambulated to bathroom 

## 2012-06-16 NOTE — ED Notes (Signed)
telepsych in progress 

## 2012-06-16 NOTE — ED Notes (Addendum)
Mom up to the desk and reports that the pt needs cpap  At night to sleep, and also needs a decongestant.  Mom is also concerned that the pt is not telling the dr's all of the information that they need to have and is requesting to speak to the EDP.  Will relay the information to the EDP

## 2012-06-17 NOTE — ED Provider Notes (Signed)
0720.  Pt sleep in bed, easily aroused.  Voices no complaints.  Note stable vital signs.  He was assessed by Dr. Trisha Mangle (telepsych).  He noted him to have a depressed mood and to voice thoughts on a plan for suicide.  His impression was one of a stable patient that would be appropriate for outpatient follow-up.  Per his original H&P and the overnight provider's impression, Peter Briggs actually attempted to harm himself by putting freon into his CPAP machine.  This is different than the history he provided to Dr. Trisha Mangle.  At this time Peter Briggs will e held and evaluated this afternoon by Dr. Elsie Saas.  Tobin Chad, MD 06/17/12 [phone]

## 2012-06-17 NOTE — ED Notes (Signed)
Act team at bedside 

## 2012-06-17 NOTE — BH Assessment (Signed)
Assessment Note   Peter Briggs is an 56 y.o. male who presents to [facility] after a suicide attempt earlier this evening.  He reports he has been feeling depressed for approximately 3 weeks.  He is overwhelmed by stress at work and stress with his girlfriend.  He reports that a few days ago he began having suicidal ideation so he gave his mother his firearms to hide.  Tonight, he attempted to kill himself by hooking his cpap machine to his vehicle and attempting to inhale freon.  He was unsuccessful and his mother saw him walking in with his machine adn he told her what he had done so she called EMS.  Upon arrival in the ED, he told EDP [name] that he continued to have suicidal ideation with a plan, but later recanted to the specialist on call. He explained to [name] that he was remorseful about his attempt because of his wavering, [name] wants the patient monitored for 24 hours and evaluated in person by [name] in the morning.    Axis I: Mood Disorder NOS Axis II: Deferred Axis III:  Past Medical History  Diagnosis Date  . Sleep apnea     on CPAP  . Hypertension   . Hyperlipidemia   . Erectile dysfunction   . Insomnia    Axis IV: occupational problems and problems with primary support group Axis V: 41-50 serious symptoms  Past Medical History:  Past Medical History  Diagnosis Date  . Sleep apnea     on CPAP  . Hypertension   . Hyperlipidemia   . Erectile dysfunction   . Insomnia     Past Surgical History  Procedure Date  . Finger surgery   . Toe surgery     Family History: History reviewed. No pertinent family history.  Social History:  reports that he has been smoking Cigars.  He does not have any smokeless tobacco history on file. He reports that he drinks alcohol. He reports that he does not use illicit drugs.  Additional Social History:  Alcohol / Drug Use History of alcohol / drug use?: No history of alcohol / drug abuse  CIWA: CIWA-Ar BP:  136/93 mmHg Pulse Rate: 84  (pt sitting and had the monitor on for 5 minutes to recheck vitals) COWS:    Allergies: No Known Allergies  Home Medications:  (Not in a hospital admission)  OB/GYN Status:  No LMP for male patient.  General Assessment Data Location of Assessment: [facility] Living Arrangements: Parent (lives with mother) Can pt return to current living arrangement?: Yes Admission Status: Voluntary Is patient capable of signing voluntary admission?: Yes Transfer from: Acute Hospital Referral Source: Self/Family/Friend  Education Status Is patient currently in school?: No  Risk to self Suicidal Ideation: No-Not Currently/Within Last 6 Months (Came to ED after attempt, currently denies) Suicidal Intent: No-Not Currently/Within Last 6 Months Is patient at risk for suicide?: No Suicidal Plan?: No-Not Currently/Within Last 6 Months (attempted to inhale freon, current denies SI) Access to Means: No Specify Access to Suicidal Means: mother hid firearms What has been your use of drugs/alcohol within the last 12 months?: n/a Previous Attempts/Gestures: No How many times?: 0  Other Self Harm Risks: n Triggers for Past Attempts: None known Intentional Self Injurious Behavior: None Family Suicide History: No Recent stressful life event(s): Turmoil (Comment);Conflict (Comment) (issues with girlfriend, job stress) Persecutory voices/beliefs?: No Depression: Yes Depression Symptoms: Isolating;Loss of interest in usual pleasures;Despondent Substance abuse history and/or treatment for  substance abuse?: No Suicide prevention information given to non-admitted patients: Not applicable  Risk to Others Homicidal Ideation: No Thoughts of Harm to Others: No Current Homicidal Intent: No Current Homicidal Plan: No Access to Homicidal Means: No History of harm to others?: No Assessment of Violence: None Noted Does patient have access to weapons?: No (mother hid firearms) Criminal  Charges Pending?: No Does patient have a court date: No  Psychosis Hallucinations: None noted Delusions: None noted  Mental Status Report Appear/Hygiene: Other (Comment) (unremarkable) Eye Contact: Fair Motor Activity: Freedom of movement Speech: Logical/coherent;Soft Level of Consciousness: Quiet/awake Mood: Depressed Affect: Depressed Anxiety Level: Moderate Thought Processes: Coherent;Relevant Judgement: Unimpaired Orientation: Person;Place;Time;Situation Obsessive Compulsive Thoughts/Behaviors: Minimal  Cognitive Functioning Concentration: Decreased Memory: Remote Impaired;Recent Intact IQ: Average Insight: Fair Impulse Control: Fair Appetite: Poor Weight Loss: 5  Weight Gain: 0  Sleep: Decreased Total Hours of Sleep: 2  Vegetative Symptoms: None  ADLScreening [facility] Assessment Services) Patient's cognitive ability adequate to safely complete daily activities?: Yes Patient able to express need for assistance with ADLs?: Yes Independently performs ADLs?: Yes (appropriate for developmental age)  Abuse/Neglect [facility]) Verbal Abuse: Denies Sexual Abuse: Denies  Prior Inpatient Therapy Prior Inpatient Therapy: No  Prior Outpatient Therapy Prior Outpatient Therapy: No  ADL Screening (condition at time of admission) Patient's cognitive ability adequate to safely complete daily activities?: Yes Patient able to express need for assistance with ADLs?: Yes Independently performs ADLs?: Yes (appropriate for developmental age)       Abuse/Neglect Assessment (Assessment to be complete while patient is alone) Verbal Abuse: Denies Sexual Abuse: Denies Exploitation of patient/patient's resources: Denies Self-Neglect: Denies Values / Beliefs Cultural Requests During Hospitalization: None Spiritual Requests During Hospitalization: None   Advance Directives (For Healthcare) Advance Directive: Patient does not have advance directive;Patient would not like  information Nutrition Screen- [facility] Adult/[facility] Patient's home diet: Regular Have you recently lost weight without trying?: No Have you been eating poorly because of a decreased appetite?: No Malnutrition Screening Tool Score: 0   Additional Information 1:1 In Past 12 Months?: No CIRT Risk: No Elopement Risk: No Does patient have medical clearance?: Yes     Disposition:  Disposition Disposition of Patient: Other dispositions Other disposition(s): Other (Comment) (further evaluation by psychiatrist)  On Site Evaluation by:  Ranae Palms Reviewed with Physician:  Malachy Mood Marlana Latus 06/17/2012 1:30 AM

## 2012-06-17 NOTE — Clinical Social Work Note (Signed)
Per Dr. Ranae Palms, pt needs to be re-evaluated.  Please see his note.  CSW initiated tele-psych consult. Vickii Penna, LCSWA [phone]  Clinical Social Work

## 2012-06-18 NOTE — ED Notes (Signed)
Report called to [facility] in [place], [place].  Report given Marylu Lund, RN.  [place]

## 2012-06-18 NOTE — ED Notes (Signed)
[employer] [facility] came to [facility] ED to transport pt. To [facility] per Kathryne Sharper, pt.'s IVC papers incorrectly signed by [employer].  ACT team called the magistrate to clarify, magistrate informed us that the IVC papers need to be redone.  In the process of redoing IVC papers now.

## 2012-06-18 NOTE — ED Notes (Signed)
Explained to pt. And his family about where pt. Was being transported to [facility]), gave pt./family copy of info of where Peter Briggs is located and phone number, explained intake process of what will happen at [facility] to pt./family.  Pt/family verbalized understanding.

## 2012-06-18 NOTE — ED Notes (Signed)
Pt. To go to [facility]. After IVC papers are redone, will call [place] for transportation.

## 2012-06-18 NOTE — ED Notes (Signed)
Pt.'s aunt at bedside.  Pt. Calm.

## 2012-06-18 NOTE — ED Provider Notes (Signed)
No new c/o  Juanita Streight, MD 06/18/12 0854 

## 2012-06-18 NOTE — ED Notes (Signed)
Pt.'s mom (caretaker) contact number:  [phone] or [phone].

## 2012-06-18 NOTE — ED Notes (Signed)
Spoke with Clide Cliff at [facility] to clarify patient medications. Medications clarified and IVC paper work faxed to [facility].

## 2012-07-05 ENCOUNTER — Ambulatory Visit (HOSPITAL_COMMUNITY)
Admission: RE | Admit: 2012-07-05 | Discharge: 2012-07-05 | Disposition: A | Discharge status: Home / Self Care | Payer: 59 | Attending: Psychiatry | Admitting: Psychiatry

## 2012-07-05 ENCOUNTER — Encounter (HOSPITAL_COMMUNITY): Payer: Self-pay | Admitting: *Deleted

## 2012-07-05 DIAGNOSIS — F329 Major depressive disorder, single episode, unspecified: Secondary | ICD-10-CM | POA: Insufficient documentation

## 2012-07-05 HISTORY — DX: Major depressive disorder, single episode, unspecified: F32.9

## 2012-07-05 NOTE — BH Assessment (Signed)
Assessment Note   Peter Briggs is an 56 y.o. male. In for an appt to be evaluated for psych IOP.He was referred here by his therapist Wallie Char who he saw for the first time yesterday and felt he would benefit from IOP, though he is not sure why specifically.He states he is depressed, with no prior history of depression.He has had multiple stressor in the past few months, including breaking up with his girlfriend, a divorce, some financial problems and is currently out of work per his therapist as of today because of his depression. Is living with his Mother, who is very supportive.He has eight children which are 44-14 yo and has a relationship with them. States he had not been sleeping for several weeks and his family Dr prescribes him Alfonso Patten and Elavil and is now sleeping 6-7 hours and feels better. Not currently on any antidepressants. He is complaining of poor concentration and focus, low energy, poor sleep, fleeting and infrequent thoughts of suicide, no plan or intent, and no previous history of attempts.He has no prior mental health treatment, and denies family history of mental illness.No current drug or alcohol use, though admits to drinking recreationally in past and smoking marijuana but not for over 1 1/2 years. Not psychotic, not delusional. Called and spoke with Plush Sink with the psych IOP and no available openings for the program at this time. She took his information and will call him if she has a cancellation in the next week and will call him when he can start the program. He was given the information re the program, Rita's name and number and walked downstairs to pick up new patient paperwork to complete to bring back on his start date and to be familiar with the location of the program. States he will call Ms Pile once he leaves here to follow up with her on the outcome of todays appt. States he is fine with this delay and reinforces that he is safe and denies thoughts to hurt  self.Mother with whom he lives with present for the entire assessment per his request and OK as well with disposition.  Axis I: Major Depression, single episode Axis II: Deferred Axis III:  Past Medical History  Diagnosis Date  . Sleep apnea     on CPAP  . Hypertension   . Hyperlipidemia   . Erectile dysfunction   . Insomnia   . Depression    Axis IV: economic problems, occupational problems and problems related to social environment Axis V: 41-50 serious symptoms  Past Medical History:  Past Medical History  Diagnosis Date  . Sleep apnea     on CPAP  . Hypertension   . Hyperlipidemia   . Erectile dysfunction   . Insomnia   . Depression     Past Surgical History  Procedure Date  . Finger surgery   . Toe surgery     Family History: No family history on file.  Social History:  reports that he has been smoking Cigars.  He does not have any smokeless tobacco history on file. He reports that he does not drink alcohol or use illicit drugs.  Additional Social History:  Alcohol / Drug Use Pain Medications: not abusing Prescriptions: not abusing Over the Counter: not abusing History of alcohol / drug use?: No history of alcohol / drug abuse  CIWA:   COWS:    Allergies: No Known Allergies  Home Medications:  (Not in a hospital admission)  OB/GYN Status:  No LMP  for male patient.  General Assessment Data Location of Assessment: [facility] Assessment Services Living Arrangements: Parent Can pt return to current living arrangement?: Yes Admission Status: Other (Comment) Is patient capable of signing voluntary admission?: Yes Transfer from: Home Referral Source:  (referred by his therapist Wallie Char)  Education Status Is patient currently in school?: No  Risk to self Suicidal Ideation: No Suicidal Intent: No Is patient at risk for suicide?: No Suicidal Plan?: No Access to Means: No What has been your use of drugs/alcohol within the last 12 months?: none in 1 to 1  1/2 years Previous Attempts/Gestures: No How many times?: 0  Other Self Harm Risks: none Triggers for Past Attempts: None known Intentional Self Injurious Behavior: None Family Suicide History: No Recent stressful life event(s): Financial Problems;Conflict (Comment) (off work for his depression, girlfriend and he broke up) Persecutory voices/beliefs?: No Depression: Yes Depression Symptoms: Isolating;Fatigue;Loss of interest in usual pleasures;Feeling worthless/self pity Substance abuse history and/or treatment for substance abuse?: No Suicide prevention information given to non-admitted patients: Not applicable  Risk to Others Homicidal Ideation: No Thoughts of Harm to Others: No Current Homicidal Intent: No Current Homicidal Plan: No Access to Homicidal Means: No History of harm to others?: No Assessment of Violence: None Noted Does patient have access to weapons?: No Criminal Charges Pending?: No Does patient have a court date: No  Psychosis Hallucinations: None noted Delusions: None noted  Mental Status Report Appear/Hygiene:  (unremarkable) Eye Contact: Fair Motor Activity: Unremarkable;Freedom of movement Speech: Logical/coherent;Soft Level of Consciousness: Alert Mood: Depressed;Sad Affect: Depressed Anxiety Level: Minimal Thought Processes: Coherent;Relevant Judgement: Impaired Orientation: Person;Place;Time;Situation Obsessive Compulsive Thoughts/Behaviors: None  Cognitive Functioning Concentration: Decreased Memory: Recent Intact;Remote Intact IQ: Average Insight: Fair Impulse Control: Good Appetite: Good Weight Loss: 60  (purposefully lost weight to help his sleep apnea) Weight Gain: 0  Sleep: Increased Vegetative Symptoms:  (had not been sleeping unitl on sleep aides)  ADLScreening [facility] Assessment Services) Patient's cognitive ability adequate to safely complete daily activities?: Yes Patient able to express need for assistance with ADLs?:  Yes Independently performs ADLs?: Yes (appropriate for developmental age)  Abuse/Neglect [facility]) Physical Abuse: Denies Verbal Abuse: Denies Sexual Abuse: Denies  Prior Inpatient Therapy Prior Inpatient Therapy: No  Prior Outpatient Therapy Prior Outpatient Therapy: No  ADL Screening (condition at time of admission) Patient's cognitive ability adequate to safely complete daily activities?: Yes Patient able to express need for assistance with ADLs?: Yes Independently performs ADLs?: Yes (appropriate for developmental age)       Abuse/Neglect Assessment (Assessment to be complete while patient is alone) Physical Abuse: Denies Verbal Abuse: Denies Sexual Abuse: Denies          Additional Information 1:1 In Past 12 Months?: No CIRT Risk: No Elopement Risk: No Does patient have medical clearance?: No     Disposition:  Disposition Disposition of Patient: Outpatient treatment Type of outpatient treatment: Psych Intensive Outpatient Other disposition(s):  (cont with therapist Wallie Char until appt with psych IOP)  On Site Evaluation by:   Reviewed with Physician:     Wynona Luna 07/05/2012 12:18 PM

## 2012-07-15 ENCOUNTER — Other Ambulatory Visit (HOSPITAL_COMMUNITY): Payer: 59 | Attending: Psychiatry | Admitting: Psychiatry

## 2012-07-15 ENCOUNTER — Encounter (HOSPITAL_COMMUNITY): Payer: Self-pay

## 2012-07-15 DIAGNOSIS — F1921 Other psychoactive substance dependence, in remission: Secondary | ICD-10-CM | POA: Insufficient documentation

## 2012-07-15 DIAGNOSIS — F411 Generalized anxiety disorder: Secondary | ICD-10-CM

## 2012-07-15 DIAGNOSIS — E785 Hyperlipidemia, unspecified: Secondary | ICD-10-CM | POA: Insufficient documentation

## 2012-07-15 DIAGNOSIS — I10 Essential (primary) hypertension: Secondary | ICD-10-CM | POA: Insufficient documentation

## 2012-07-15 DIAGNOSIS — G4700 Insomnia, unspecified: Secondary | ICD-10-CM | POA: Insufficient documentation

## 2012-07-15 DIAGNOSIS — F322 Major depressive disorder, single episode, severe without psychotic features: Secondary | ICD-10-CM | POA: Insufficient documentation

## 2012-07-15 DIAGNOSIS — F332 Major depressive disorder, recurrent severe without psychotic features: Secondary | ICD-10-CM | POA: Insufficient documentation

## 2012-07-15 DIAGNOSIS — F331 Major depressive disorder, recurrent, moderate: Secondary | ICD-10-CM

## 2012-07-15 NOTE — Progress Notes (Signed)
    Daily Group Progress Note  Program: IOP  Group Time: 9:00-10:30 am   Participation Level: Active  Behavioral Response: Appropriate  Type of Therapy:  Process Group  Summary of Progress: Today was patients first day in the group. Upon entering, he appeared anxious and nervous, but as he listened to others talking, his facial affect and body language softened and he appeared more comfortable. He said right before the end of this portion of the group that he could relate to what others were saying about themes of "trying to please others" and having a strong need to "fix things in relationships with others" for him to be able to function. He said he was glad that he was here and planned to return tomorrow to share.      Group Time: 10:30 am - 12:00 pm   Participation Level:  Active  Behavioral Response: Appropriate  Type of Therapy: Psycho-education Group  Summary of Progress: Patient participated in a goodbye ceremony and practiced the skill of having healthy closure and processing feelings associated with losses.   Carman Ching, LCSW

## 2012-07-15 NOTE — Progress Notes (Deleted)
[facility] Behavioral Health Biopsychosocial Assessment  Peter Briggs 56 y.o. 07/15/2012   Referred by: ***   PRESENTING PROBLEM Chief Complaint: *** What are the main stressors in your life right now?  {CHL AMB BH LIFE STRESSORS:22831}   Describe a brief history of your present symptoms: ***  How long have you had these symptoms?: *** What effect have they had on your life?: ***   FAMILY ASSESSMENT Was the significant other/family member interviewed? {BHH YES OR NO:22294} If No, why?: *** Is significant other/family member supportive? {BHH YES OR NO:22294} Did significant other/family member express concerns for the patient? {BHH YES OR NO:22294} If Yes, describe: ***  Is significant other/family member willing to be part of treatment plan? {BHH YES OR NO:22294} Describe significant other/family member's perception of patient's illness: ***  Describe significant other/family member's perception of expectations with treatment: ***   MENTAL HEALTH HISTORY Have you ever been treated for a mental health problem? {BHH YES OR NO:22294}  If Yes, when? *** , where? ***, by whom? ***  Are you currently seeing a therapist or counselor? {BHH YES OR NO:22294} If Yes, whom? *** Have you ever had a mental health hospitalization? {BHH YES OR NO:22294} If Yes, when? *** , where? ***, why? ***, how many times? *** Have you ever had suicidal thoughts or attempted suicide? {BHH YES OR NO:22294} If Yes, when? ***  Describe ***  Have you ever been treated with medication for a mental health problem? {BHH YES OR NO:22294} If Yes, please list as completely as possible (name of medication, reason prescribed, and response: ***   FAMILY MENTAL HEALTH HISTORY Is there any history of mental health problems or substance abuse in your family? {BHH YES OR NO:22294} If Yes, please explain (include information on parents, siblings, aunts/uncles, grandparents, cousins, etc.): *** Has anyone in  your family been hospitalized for mental health problems? {BHH YES OR NO:22294} If Yes, please explain (including who, where, and for what length of time): ***   MARITAL STATUS Are you presently: {Marital Status:22678} How many times have you been married? *** Dates of previous marriages: *** Do you have any concerns regarding marriage? {BHH YES OR NO:22294} If Yes, please explain: ***  Do you have any children? {BHH YES OR NO:22294} If Yes, how many? *** Please list their sexes and ages: ***   LEISURE/RECREATION Describe how patient spends leisure time: ***   SOCIAL AND FAMILY HISTORY Who lives in your current household? *** Where were you born? *** Where did you grow up? *** Describe the household where you grew up: ***  Do you have siblings, step/half siblings? {BHH YES OR NO:22294} If Yes, please list names, sex and ages: *** Are your parents still living? {BHH YES OR NO:22294} If No, what was the cause of death? *** If Yes, father's age: ***   His health: *** If Yes, mother's age: *** Her health: *** Where do your parents live? *** Do you see them often? {BHH YES OR NO:22294} If No, why not? ***  Are your parents separated/divorced? {BHH YES OR NO:22294} If Yes, approximately when? *** Have you ever been exposed to any form of abuse? {BHH YES OR NO:22294} If Yes: {Type of abuse:20566} Did the abuse happen recently, or in the past? *** Were you the victim or offender, please explain: ***  Are you having problems with any member or your family? {BHH YES OR NO:22294} If Yes, please explain: ***  What Religion are you? *** Do you  have any cultural or religious beliefs which could impact your treatment? {BHH YES OR NO:22294} If Yes, please explain (including customs, celebrations, attitude towards alcohol and drugs, authority in family, etc):  ***  Have you ever been in the [employer]? {BHH YES OR NO:22294} If Yes, when? *** for how long? ***  Were you ever in active  combat? {BHH YES OR NO:22294} If Yes, when? *** for how long? *** Were there any lasting effects on you? {BHH YES OR NO:22294} If Yes, please explain: ***  Why did you leave the military (include type of discharge, disciplinary action, substance abuse, or any Post Traumatic Stress Symptoms): ***  Do you have any legal problems/involvements? {BHH YES OR NO:22294} If Yes, please explain: ***   EDUCATIONAL BACKGROUND How many grades have you completed? {misc; education:31912} Do you hold any Degrees? {BHH YES OR NO:22294} If Yes, in what? ***  From where? *** What were your special talents/interests in school? ***  Did you have any problems in school? {BHH YES OR NO:22294} If Yes, were these problems behavioral, attentional, or due to learning difficulties? *** Were any medications ever prescribed for these problems? {BHH YES OR NO:22294} If Yes, what were the medications, including the dosage, how long you took these and who prescribed them? ***   WORK HISTORY Do you work? {BHH YES OR NO:22294} If Yes, what is your occupation? *** How long have you been employed there? ***  Name of employer: *** Do you enjoy your present job? {BHH YES OR NO:22294} What is your previous work history? *** Are you having trouble on your present job or had difficulties holding a job? {BHH YES OR NO:22294} If Yes, please explain: ***  Does your spouse work? {BHH YES OR NO:22294} If Yes, where and for how long? *** Are you under financial stress? {BHH YES OR NO:22294} If Yes, please explain: ***  Financial Resources  Patient is: Self supportive (no assistance) {BHH YES OR NO:22294}    Requires referral for financial assistance {BHH YES OR ZO:10960}  Requires referral for credit counseling {BHH YES OR NO:22294}  Current situation affects financial situation {BHH YES OR AV:40981  Adolescent/child in need of financial support {BHH YES OR NO:22294} Is there anything else you would like to tell us?  Carman Ching, LCSW 07/15/2012

## 2012-07-15 NOTE — Progress Notes (Signed)
Patient ID: EBERT BLOODSWORTH, male   DOB: 04-04-1956, 56 y.o.   MRN: [id number] D:  This is a 56 year old divorced caucasian male, who was referred per his therapist Gunnar Fusi Pile, [facility]), treatment for depressive and anxiety symptoms.  States he started decompensating June 03, 2012 and became suicidal.     He was referred here by his therapist Wallie Char who he saw for the first time yesterday and felt he would benefit from IOP, though he is not sure why specifically.He states he is depressed.  He has had multiple stressors in the past few months, including breaking up with his girlfriend, a divorce, some financial problems and is currently out of work per his therapist, because of his depression. Is living with his Mother, who is very supportive.  He has eight children which are ages 20-14 yo and has a relationship with them. The 55 yo resides with him and he worries if he'll be able to take care of her financially.  States he hasn't been sleeping for several weeks and his family Dr prescribes him Alfonso Patten and Elavil and is now sleeping 6-7 hours and feels better. Not currently on any antidepressants. He is complaining of poor concentration and focus, low energy, poor sleep, fleeting and infrequent thoughts of suicide, no plan or intent.  Discussed safety options with patient and he is able to contract for safety.  Denies any A/V hallucinations.  He has prior mental health treatment whenever there was a suicide attempt 06-16-12.  Pt states he hooked freon to his C-Pap mask.  He was admitted to [facility] for three days.   States childhood was good except for when he was age 24 and his parents divorced. No current drug or alcohol use, though admits to drinking recreationally in past and smoking marijuana but not for over 1 1/2 years.  Two prior DWI's. Pt completed all forms.  Scored 23 on the burns.  A:  Oriented pt.  Provided pt with orientation folder.  Informed Wallie Char, LPC of admit.  Encouraged  support groups.  R:  Pt receptive.

## 2012-07-16 ENCOUNTER — Encounter (HOSPITAL_COMMUNITY): Payer: 59 | Attending: Psychiatry

## 2012-07-16 ENCOUNTER — Telehealth (HOSPITAL_COMMUNITY): Payer: Self-pay | Admitting: Psychiatry

## 2012-07-16 DIAGNOSIS — G4730 Sleep apnea, unspecified: Secondary | ICD-10-CM | POA: Insufficient documentation

## 2012-07-16 DIAGNOSIS — F332 Major depressive disorder, recurrent severe without psychotic features: Secondary | ICD-10-CM | POA: Insufficient documentation

## 2012-07-16 DIAGNOSIS — E785 Hyperlipidemia, unspecified: Secondary | ICD-10-CM | POA: Insufficient documentation

## 2012-07-16 DIAGNOSIS — F411 Generalized anxiety disorder: Secondary | ICD-10-CM | POA: Insufficient documentation

## 2012-07-16 DIAGNOSIS — I10 Essential (primary) hypertension: Secondary | ICD-10-CM | POA: Insufficient documentation

## 2012-07-16 DIAGNOSIS — F1921 Other psychoactive substance dependence, in remission: Secondary | ICD-10-CM | POA: Insufficient documentation

## 2012-07-16 DIAGNOSIS — G4700 Insomnia, unspecified: Secondary | ICD-10-CM | POA: Insufficient documentation

## 2012-07-16 NOTE — Telephone Encounter (Signed)
D:  Placed call to pt to check on him since he wasn't in group today.  He states he didn't sleep well lastnight.  Reports increased anxiety.  "I am worried about not having enough money."  This Clinical research associate informed pt that his insurance company authorized twelve MH-IOP days for him to attend. Encouraged pt to return to the program tomorrow.  R:  Pt receptive.

## 2012-07-17 ENCOUNTER — Encounter (HOSPITAL_COMMUNITY): Payer: 59

## 2012-07-17 ENCOUNTER — Telehealth (HOSPITAL_COMMUNITY): Payer: Self-pay | Admitting: Psychiatry

## 2012-07-18 ENCOUNTER — Encounter (HOSPITAL_COMMUNITY): Payer: 59 | Admitting: Psychiatry

## 2012-07-18 DIAGNOSIS — F322 Major depressive disorder, single episode, severe without psychotic features: Secondary | ICD-10-CM

## 2012-07-18 NOTE — Progress Notes (Signed)
Psychiatric Assessment Adult  Patient Identification:  Peter Briggs Date of Evaluation:  07/18/2012 Chief Complaint: depression and anxiety History of Chief Complaint:  56 year old divorced caucasian male, who was referred per his therapist Gunnar Fusi Pile, [facility]), treatment for depressive and anxiety symptoms. States he started decompensating June 03, 2012 and became suicidal.  He was referred here by his therapist Wallie Char who he saw for the first time yesterday and felt he would benefit from IOP, though he is not sure why specifically.He states he is depressed. He has had multiple stressors in the past few months, including breaking up with his girlfriend, a divorce, some financial problems and is currently out of work per his therapist, because of his depression. Is living with his Mother, who is very supportive. He has eight children which are ages 2-14 yo and has a relationship with them. The 55 yo resides with him and he worries if he'll be able to take care of her financially. States he hasn't been sleeping for several weeks and his family Dr prescribes him Alfonso Patten and Elavil and is now sleeping 6-7 hours and feels better. Not currently on any antidepressants. He is complaining of poor concentration and focus, low energy, poor sleep, fleeting and infrequent thoughts of suicide, no plan or intent. Discussed safety options with patient and he is able to contract for safety. Denies any A/V hallucinations. He has prior mental health treatment whenever there was a suicide attempt 06-16-12. Pt states he hooked freon to his C-Pap mask. He was admitted to [facility] for three days.  States childhood was good except for when he was age 41 and his parents divorced.  No current drug or alcohol use, though admits to drinking recreationally in past and smoking marijuana but not for over 1 1/2 years. Two prior DWI's.      HPI Review of Systems Physical Exam  Depressive Symptoms: depressed  mood, anhedonia, insomnia, psychomotor retardation, fatigue, feelings of worthlessness/guilt, difficulty concentrating, hopelessness, anxiety, loss of energy/fatigue, decreased appetite,  (Hypo) Manic Symptoms:   Elevated Mood:  No Irritable Mood:  Yes Grandiosity:  No Distractibility:  Yes Labiality of Mood:  No Delusions:  No Hallucinations:  No Impulsivity:  No Sexually Inappropriate Behavior:  No Financial Extravagance:  No Flight of Ideas:  No  Anxiety Symptoms: Excessive Worry:  Yes Panic Symptoms:  No Agoraphobia:  No Obsessive Compulsive: No  Symptoms: None, Specific Phobias:  No Social Anxiety:  Yes  Psychotic Symptoms: none  PTSD Symptoms:none  Traumatic Brain Injury: No   Past Psychiatric History: Diagnosis: impression  Hospitalizations: [facility] at [facility].  Outpatient Care:   Substance Abuse Care:   Self-Mutilation:   Suicidal Attempts:   Violent Behaviors:    Past Medical History:   Past Medical History  Diagnosis Date  . Sleep apnea     on CPAP  . Hypertension   . Hyperlipidemia   . Erectile dysfunction   . Insomnia   . Depression    History of Loss of Consciousness:  No Seizure History:  No Cardiac History:  No Allergies:  No Known Allergies Current Medications:  Current Outpatient Prescriptions  Medication Sig Dispense Refill  . amitriptyline (ELAVIL) 25 MG tablet Take 25 mg by mouth at bedtime.      Marland Kitchen aspirin 81 MG tablet Take 81 mg by mouth daily.      Marland Kitchen buPROPion (WELLBUTRIN XL) 150 MG 24 hr tablet Take 1 tablet (150 mg total) by  mouth daily.  30 tablet  1  . Eszopiclone (ESZOPICLONE) 3 MG TABS Take 1 tablet (3 mg total) by mouth at bedtime. Take immediately before bedtime  30 tablet  1  . Niacin-[facility]) 1000-40 MG TB24 Take 1 tablet by mouth at bedtime.       . tadalafil (CIALIS) 5 MG tablet Take 5 mg by mouth daily as needed. Erectile dysfunction        Previous  Psychotropic Medications:  Medication Dose                         Substance Abuse History in the last 12 months: Substance Age of 1st Use Last Use Amount Specific Type  Nicotine      Alcohol teenager 8 months ago Used to be a heavy user   Cannabis 15 16 months ago    Opiates      Cocaine      Methamphetamines      LSD      Ecstasy      Benzodiazepines      Caffeine      Inhalants      Others:                          Medical Consequences of Substance Abuse: na  Legal Consequences of Substance Abuse:   Family Consequences of Substance Abuse:   Blackouts:  Yes DT's:  No Withdrawal Symptoms:  Yes Headaches  Social History: Current Place of Residence:  Place of Birth:  Family Members:  Marital Status:  Single Children: 8  Sons:   Daughters:  Relationships:  Education:  HS Print production planner Problems/Performance:  Religious Beliefs/Practices:  History of Abuse: none Teacher, music History:  None. Legal History:  Hobbies/Interests:   Family History:  Multiple family members are alcoholics on both sides  Mental Status Examination/Evaluation: Objective:  Appearance: Casual  Eye Contact::  Fair  Speech:  Normal Rate  Volume:  Decreased  Mood:  Depressed and anxious  Affect:  Constricted, Depressed and Flat  Thought Process:  Goal Directed  Orientation:  Full  Thought Content:  Rumination  Suicidal Thoughts:  No  Homicidal Thoughts:  No  Judgement:  Fair  Insight:  Fair  Psychomotor Activity:  Normal  Akathisia:  No  Handed:  Right  AIMS (if indicated):    Assets:  Communication Skills Desire for Improvement Physical Health Resilience Social Support    Laboratory/X-Ray Psychological Evaluation(s)   none     Assessment:  Axis I: Major Depression, Recurrent severe  AXIS I Generalized Anxiety Disorder and Major Depression, Recurrent severe,polysubstance abuse in remission  AXIS II Deferred  AXIS III Past Medical  History  Diagnosis Date  . Sleep apnea     on CPAP  . Hypertension   . Hyperlipidemia   . Erectile dysfunction   . Insomnia   . Depression      AXIS IV economic problems, other psychosocial or environmental problems, problems related to social environment and problems with primary support group  AXIS V 51-60 moderate symptoms   Treatment Plan/Recommendations:  Plan of Care: start IOP  Laboratory:  none at this time  Psychotherapy: group and individual therapy  Medications: continue Wellbutrin XL 150 mg by mouth q. A.m. And he'll increase it to 300 mg in 2 days.  Routine PRN Medications:  Yes  Consultations:   Safety Concerns:    Other:     Vaneza Pickart,  Kennet Mccort [facility] 11/21/201312:09 PM

## 2012-07-18 NOTE — Progress Notes (Signed)
    Daily Group Progress Note  Program: IOP  Group Time: 9:00-10:30 am   Participation Level: None  Behavioral Response: Appropriate  Type of Therapy:  Process Group  Summary of Progress: Patient appeared uncomfortable being in the group and did not share. He is working on feeling comfortable sharing in a group setting.      Group Time: 10:30 am - 12:00 pm   Participation Level:  None  Behavioral Response: none  Type of Therapy: none  Summary of Progress: Patient was absent from this group due to having a prescheduled doctors appointment he needed to attend.   Carman Ching, LCSW

## 2012-07-19 ENCOUNTER — Encounter (HOSPITAL_COMMUNITY): Payer: 59 | Admitting: Psychiatry

## 2012-07-19 DIAGNOSIS — F322 Major depressive disorder, single episode, severe without psychotic features: Secondary | ICD-10-CM

## 2012-07-19 NOTE — Progress Notes (Signed)
Patient ID: Peter Briggs, male   DOB: 1956-03-24, 56 y.o.   MRN: [id number] D:  Met with pt, per Dr. Debbora Presto request in order to update all patient's current medications in EPIC.  Pt did bring in an updated medication list.  States his PCP (Dr. Clelia Croft) made some changes yesterday.  A:  Inform Dr. Rutherford Limerick.  R:  Pt receptive.

## 2012-07-19 NOTE — Progress Notes (Signed)
    Daily Group Progress Note  Program: IOP  Group Time: 9:00-10:30 am   Participation Level: Active  Behavioral Response: Appropriate  Type of Therapy:  Process Group  Summary of Progress: Patient shared for the first time and interacted with other group members. He talked of having high symptoms of depression that led to his recent suicide attempt. He said he feels like a failure for needing help with his depression due to always "being the strong one in his family". This is his first experience with depression that began after a recent breakup. He said it was helpful to have the support of the group and he is grateful to feel alive, but is concerned that he will be able to feel happy again.      Group Time: 10:30 am - 12:00 pm   Participation Level:  Active  Behavioral Response: Appropriate  Type of Therapy: Psycho-education Group  Summary of Progress: Patient participated in a goodbye ceremony to a member ending the group today and practiced the skills of expressing emotions and having healthy closure.  Carman Ching, LCSW

## 2012-07-22 ENCOUNTER — Encounter (HOSPITAL_COMMUNITY): Payer: 59 | Admitting: Psychiatry

## 2012-07-22 DIAGNOSIS — F322 Major depressive disorder, single episode, severe without psychotic features: Secondary | ICD-10-CM

## 2012-07-22 MED ORDER — RISPERIDONE 1 MG PO TABS: 1.0000 mg | ORAL_TABLET | Freq: Every day | ORAL | Status: DC

## 2012-07-22 MED ORDER — CLONAZEPAM 1 MG PO TABS: 1.0000 mg | ORAL_TABLET | Freq: Two times a day (BID) | ORAL | Status: DC

## 2012-07-22 NOTE — Progress Notes (Signed)
    Daily Group Progress Note  Program: IOP  Group Time: 9:00-10:30 am   Participation Level: Active  Behavioral Response: Appropriate  Type of Therapy:  Process Group  Summary of Progress: Patient reports high anxiety today and struggled with slowed thought processesing and clear, goal directed thought. He shared how he fears he could loose his home due to not paying the bills and states he is unsure if he could also lose his job due to not having the required paperwork turned into them. He requires direct questioning to put thoughts together and appears confused. He agree to Scientist, physiological and case manger to talk with his employer to file paperwork to cover time off from work due to onset of depression symptoms. He is struggling with asking for help, feelings of guilt over needing help and feeling worthless. This is patients first experience with depression and is learning about the symptoms associated with the illness.      Group Time: 10:30 am - 12:00 pm   Participation Level:  Active  Behavioral Response: Appropriate  Type of Therapy: Psycho-education Group  Summary of Progress: Patient participated in a group on grief and loss and on identifying healthy ways to grieve current losses impacted overall wellness.   Carman Ching, LCSW

## 2012-07-22 NOTE — Progress Notes (Signed)
Patient ID: Peter Briggs, male   DOB: 05-20-1956, 56 y.o.   MRN: [id number]  is patient seen along with Maxcine Ham , and Jeri Modena. Carollee Herter was concerned about the patient's ability to process because of his severe anxiety. Patient has been ruminating about losing his house and his inability to provide for his family. Patient is quite dysphoric and anxious. States that he cannot think clearly and has insomnia. No suicidal or homicidal ideation and no hallucinations or delusions. Discussed rationale risks benefits and options of Klonopin for his anxiety and Risperdal for mood stabilization and rumination, patient gave me his informed consent. He'll be started on Risperdal 1 mg at bedtime and Klonopin 1 mg twice a day.

## 2012-07-23 ENCOUNTER — Encounter (HOSPITAL_COMMUNITY): Payer: 59 | Admitting: Psychiatry

## 2012-07-23 DIAGNOSIS — F322 Major depressive disorder, single episode, severe without psychotic features: Secondary | ICD-10-CM

## 2012-07-23 NOTE — Progress Notes (Signed)
    Daily Group Progress Note  Program: IOP  Group Time: 9:00-10:30 am   Participation Level: Active  Behavioral Response: Appropriate  Type of Therapy:  Process Group  Summary of Progress: Patient talked more today than previous days and his thoughts were slightly more goal directed but still slowed. He talked of not looking forward to Thanksgiving this week due to having high depression symptoms and no desire to be around other people and lack of energy and motivation to leave his home. He also described a history of strained romantic relationships that cause stress around the holidays. He shared that he is struggling to see his depression as a medical condition and is upset with himself for not being able to "just get over it". His negative thoughts towards himself is something that he is currently struggling with and this is a main focus of treatment.      Group Time: 10:30 am - 12:00 pm   Participation Level:  Active  Behavioral Response: Appropriate  Type of Therapy: Psycho-education Group  Summary of Progress: patient participated in education skills segment on learning the medical symptoms of depression and identifying what current symptoms patient is experiencing to become away of how to recognize symptoms going forward.   Carman Ching, LCSW

## 2012-07-24 ENCOUNTER — Other Ambulatory Visit (HOSPITAL_COMMUNITY): Payer: Self-pay | Admitting: Psychiatry

## 2012-07-24 ENCOUNTER — Encounter (HOSPITAL_COMMUNITY): Payer: 59 | Admitting: Psychiatry

## 2012-07-24 DIAGNOSIS — F322 Major depressive disorder, single episode, severe without psychotic features: Secondary | ICD-10-CM

## 2012-07-24 LAB — CBC WITH DIFFERENTIAL/PLATELET
Basophils Absolute: 0 K/uL (ref 0.0–0.1)
Basophils Relative: 0 % (ref 0–1)
Eosinophils Absolute: 0.4 K/uL (ref 0.0–0.7)
Eosinophils Relative: 4 % (ref 0–5)
HCT: 49 % (ref 39.0–52.0)
Hemoglobin: 17.1 g/dL — ABNORMAL HIGH (ref 13.0–17.0)
Lymphocytes Relative: 17 % (ref 12–46)
Lymphs Abs: 1.6 K/uL (ref 0.7–4.0)
MCH: 32.3 pg (ref 26.0–34.0)
MCHC: 34.9 g/dL (ref 30.0–36.0)
MCV: 92.6 fL (ref 78.0–100.0)
Monocytes Absolute: 0.9 K/uL (ref 0.1–1.0)
Monocytes Relative: 10 % (ref 3–12)
Neutro Abs: 6.3 K/uL (ref 1.7–7.7)
Neutrophils Relative %: 69 % (ref 43–77)
Platelets: 181 K/uL (ref 150–400)
RBC: 5.29 MIL/uL (ref 4.22–5.81)
RDW: 13.5 % (ref 11.5–15.5)
WBC: 9.2 K/uL (ref 4.0–10.5)

## 2012-07-24 LAB — COMPREHENSIVE METABOLIC PANEL WITH GFR
ALT: 17 U/L (ref 0–53)
AST: 13 U/L (ref 0–37)
Albumin: 4 g/dL (ref 3.5–5.2)
Alkaline Phosphatase: 51 U/L (ref 39–117)
BUN: 15 mg/dL (ref 6–23)
CO2: 30 meq/L (ref 19–32)
Calcium: 9.3 mg/dL (ref 8.4–10.5)
Chloride: 102 meq/L (ref 96–112)
Creat: 1.36 mg/dL — ABNORMAL HIGH (ref 0.50–1.35)
Glucose, Bld: 82 mg/dL (ref 70–99)
Potassium: 4.1 meq/L (ref 3.5–5.3)
Sodium: 141 meq/L (ref 135–145)
Total Bilirubin: 0.4 mg/dL (ref 0.3–1.2)
Total Protein: 6 g/dL (ref 6.0–8.3)

## 2012-07-24 LAB — T4: T4, Total: 5.8 ug/dL (ref 5.0–12.5)

## 2012-07-24 NOTE — Progress Notes (Signed)
    Daily Group Progress Note  Program: IOP  Group Time: 9:00-10:30 am   Participation Level: Active  Behavioral Response: Appropriate  Type of Therapy:  Process Group  Summary of Progress: Patient reports an improvement in mood and decrease in depression symptoms. His thoughts are clearer and speech accelerated. He states he slept seven hours last night and feels rested for the first time in a long time. He is now longer as anxious and worried about things in his life and they are not running through his mind constantly. He said it feels good to feel some improvement, but still questions if he will be able to return to his previous level of functioning. He is concerned about his work approving all of time off and that is his main source of anxiety currently as he awaits their decision.      Group Time: 10:30 am - 12:00 pm   Participation Level:  Active  Behavioral Response: Appropriate  Type of Therapy: Psycho-education Group  Summary of Progress: Patient shared concerns about the upcoming Thanksgiving holiday and created a holiday wellness plan to have wellness symptoms managed until group resumes next Monday.   Carman Ching, LCSW

## 2012-07-26 ENCOUNTER — Encounter (HOSPITAL_COMMUNITY): Payer: 59

## 2012-07-29 ENCOUNTER — Encounter (HOSPITAL_COMMUNITY): Payer: 59 | Attending: Psychiatry | Admitting: Psychiatry

## 2012-07-29 DIAGNOSIS — F332 Major depressive disorder, recurrent severe without psychotic features: Secondary | ICD-10-CM | POA: Insufficient documentation

## 2012-07-29 DIAGNOSIS — F411 Generalized anxiety disorder: Secondary | ICD-10-CM | POA: Insufficient documentation

## 2012-07-29 DIAGNOSIS — F1921 Other psychoactive substance dependence, in remission: Secondary | ICD-10-CM | POA: Insufficient documentation

## 2012-07-29 DIAGNOSIS — F322 Major depressive disorder, single episode, severe without psychotic features: Secondary | ICD-10-CM

## 2012-07-29 DIAGNOSIS — E785 Hyperlipidemia, unspecified: Secondary | ICD-10-CM | POA: Insufficient documentation

## 2012-07-29 DIAGNOSIS — I10 Essential (primary) hypertension: Secondary | ICD-10-CM | POA: Insufficient documentation

## 2012-07-29 DIAGNOSIS — G4730 Sleep apnea, unspecified: Secondary | ICD-10-CM | POA: Insufficient documentation

## 2012-07-29 DIAGNOSIS — G4700 Insomnia, unspecified: Secondary | ICD-10-CM | POA: Insufficient documentation

## 2012-07-29 NOTE — Progress Notes (Signed)
    Daily Group Progress Note  Program: IOP  Group Time: 9:00-10:30 am   Participation Level: Minimal  Behavioral Response: withdrawn  Type of Therapy:  Process Group  Summary of Progress: Patient appeared disengaged from the group today and only spoke when called upon. His speech was slowed again today and his thought process appeared restricted. He said he struggled to participate today with others because he was "stuck in his own head worrying about his stressors". He is still concerned about his job approving his leave and having a job to return to after seeking treatment for depression.      Group Time: 10:30 am - 12:00 pm   Participation Level:  Minimal  Behavioral Response: Appropriate  Type of Therapy: Psycho-education Group  Summary of Progress: Patient participated in a group on grief and loss facilitated by Theda Belfast and identified the purpose of grief and what would have to occur to move through the grief.   Carman Ching, LCSW

## 2012-07-29 NOTE — Progress Notes (Signed)
Patient wants to know his blood work results.  It was done on Friday.  Discussed the results with the patient.  His creatinine is 1.36.  He is a normal TSH, normal liver function test, normal BUN and normal CBC.  His basic chemistry is otherwise normal.

## 2012-07-30 ENCOUNTER — Encounter (HOSPITAL_COMMUNITY): Payer: 59 | Admitting: Psychiatry

## 2012-07-30 DIAGNOSIS — F322 Major depressive disorder, single episode, severe without psychotic features: Secondary | ICD-10-CM

## 2012-07-30 NOTE — Progress Notes (Signed)
    Daily Group Progress Note  Program: IOP  Group Time: 9:00-10:30 am   Participation Level: Minimal  Behavioral Response: Appropriate  Type of Therapy:  Process Group  Summary of Progress: Patient did not share for the second time in a row. He appears to be "in his own thoughts" about his stressors. He is struggling to connect with the group and share. Writer informed the doctor and case manage to explore possible medication options. He reports no change in his depression in a few days.      Group Time: 10:30 am - 12:00 pm   Participation Level:  Minimal  Behavioral Response: Appropriate  Type of Therapy: Psycho-education Group  Summary of Progress: Patient participated in a discussion and education segment on self-esteem and the causes of low self-esteem. Patient identified having low self-esteem currently and gave examples from the past that impacted how patient currently views their self-worth and how this is impacting their behavior today.  Carman Ching, LCSW

## 2012-07-31 ENCOUNTER — Encounter (HOSPITAL_COMMUNITY): Payer: 59 | Admitting: Psychiatry

## 2012-07-31 DIAGNOSIS — F322 Major depressive disorder, single episode, severe without psychotic features: Secondary | ICD-10-CM

## 2012-08-01 ENCOUNTER — Encounter (HOSPITAL_COMMUNITY): Payer: 59 | Admitting: Psychiatry

## 2012-08-01 DIAGNOSIS — F322 Major depressive disorder, single episode, severe without psychotic features: Secondary | ICD-10-CM

## 2012-08-01 NOTE — Progress Notes (Signed)
    Daily Group Progress Note  Program: IOP  Group Time: 9:00-10:30 am   Participation Level: Active  Behavioral Response: Appropriate  Type of Therapy:  Process Group  Summary of Progress: Patient reports feeling "tired" today and continues to struggle to engage with the group. He said he struggles to "get out of his head" and appears to be thinking about internal thoughts during group. He has slow processes sing and states he is still anxious about work and if he still has a job.      Group Time: 10:30 am - 12:00 pm   Participation Level:  Active  Behavioral Response: Appropriate  Type of Therapy: Psycho-education Group  Summary of Progress: Patient discussed the homework assignment from the previous group on self-esteem and listed positive qualities about self to challenge negative perceptions and self-talk.   Carman Ching, LCSW

## 2012-08-01 NOTE — Progress Notes (Signed)
    Daily Group Progress Note  Program: IOP  Group Time: 9:00-10:30 am   Participation Level: Active  Behavioral Response: Appropriate  Type of Therapy:  Process Group  Summary of Progress: Patient talked about how he is struggling with slowed thinking and talking associated with his depression. He said this is not normal behavior for him. He shared his sadness associated with the recent breakup from his girlfriend and states that he feels he is grieving this loss appropriately. He said he is making progress with his depression and feels less stressful than when he began in the group.     Group Time: 10:30 am - 12:00 pm   Participation Level:  Active  Behavioral Response: Appropriate  Type of Therapy: Psycho-education Group  Summary of Progress: Patient participated in learning the DBT skill of Mindfulness. Patient learned the steps to accessing WISE mind through observe, describe and participate and was given homework to practice the skill.  Carman Ching, LCSW

## 2012-08-01 NOTE — Progress Notes (Signed)
Patient ID: Peter Briggs, male   DOB: 10-Oct-1955, 56 y.o.   MRN: [id number] Patient reviewed and interviewed with Jeri Modena today states that he's, calmer and doing better. His sleep has improved and he is able to sleep 7 hours at night. Appetite is good mood is fair does not feel hopeless helpless and has no suicidal or homicidal ideation. Patient to the pressure was checked and sitting was 1 22/ 82 and standing was 100/84.   Discussed talking to his primary care physician Dr. Clelia Croft regarding his blood pressure medications. Patient wants clearance certificate and we discussed tha twe . Will be able to give him a clearance regarding his mental health and he'll need to get one from Dr. Sherryll Burger regarding his blood pressure.patient stated understanding. His tolerating his medications well and coping well. Labs done were  #1 CBC was normal. #2 CMP showed a creatinine that was elevated at 1.36. #3 TSH and T4 were normal. Results were discussed with the patient.

## 2012-08-02 ENCOUNTER — Encounter (HOSPITAL_COMMUNITY): Payer: 59 | Admitting: Psychiatry

## 2012-08-02 DIAGNOSIS — F322 Major depressive disorder, single episode, severe without psychotic features: Secondary | ICD-10-CM

## 2012-08-02 NOTE — Progress Notes (Signed)
    Daily Group Progress Note  Program: IOP  Group Time: 9:00-10:30 am   Participation Level: Active  Behavioral Response: Appropriate  Type of Therapy:  Process Group  Summary of Progress: Patient reports making progress with his depression since he started the program. He talked about how he is realizing the importance of having a daily routine and schedule to avoid depression symptoms. He states when he does not have anywhere to go, his depression increases. He said he is feeling more able to maintain his appearance and has improved motivation and energy, but still feels the need to stay with his mother for the time being until he is better able to manage his life on his own. He still worries about his job approving his time off.      Group Time: 10:30 am - 12:00 pm   Participation Level:  Active  Behavioral Response: Appropriate  Type of Therapy: Psycho-education Group  Summary of Progress: Patient practiced the skill of Mindfulness that was discussed yesterday and reported back on what patient did for homework to practice the skill. Patient discussed ways to continue using mindfulness throughout the day to manage stress and make more affective decisions. Patient practiced a ten minute mindfulness meditation Mortimer Fries) and was given access to the web site to use over the weekend.  Carman Ching, LCSW

## 2012-08-05 ENCOUNTER — Encounter (HOSPITAL_COMMUNITY): Payer: 59 | Admitting: Psychiatry

## 2012-08-05 DIAGNOSIS — F322 Major depressive disorder, single episode, severe without psychotic features: Secondary | ICD-10-CM

## 2012-08-05 NOTE — Patient Instructions (Addendum)
Patient completed MH-IOP today.  Follow up with PCP (Dr. Clelia Croft) and Wallie Char, [facility].  Writer attempted to call Gunnar Fusi for a follow up appt, but there was no answer.  Pt will call Dr. Clelia Croft and Gunnar Fusi for appointments.  Encouraged support groups.

## 2012-08-05 NOTE — Progress Notes (Signed)
    Daily Group Progress Note  Program: IOP  Group Time: 9:00-10:30 am   Participation Level: Active  Behavioral Response: Appropriate  Type of Therapy:  Process Group  Summary of Progress: Note was handwritten by Jennifer Brown, LPC and scanned into the EPIC system - refer to the Media section for this note     Group Time: 10:30 am - 12:00 pm   Participation Level:  Active  Behavioral Response: Appropriate  Type of Therapy: Psycho-education Group  Summary of Progress: Note was handwritten by Jennifer Brown, LPC and scanned into the EPIC system - refer to the Media section for this note  Peter Chiu E, LCSW 

## 2012-08-05 NOTE — Progress Notes (Signed)
  [facility] Intensive Outpatient Program Discharge Summary  [name] [id number]  Discharge Note  Patient:  Peter Briggs is an 56 y.o., male DOB:  1956/08/09  Date of Admission:  07-15-12  Date of Discharge:  08-05-12  Reason for Admission:Depression and anxiety  Hospital Course:Pt started IOP, and His wellbutrin  xl   was increased to 300 mg q am, his Elavil was dc and because of his severe rumination and anxiety he was started On Risperdal 1 mg q hs and Klonopin 1 mg q hs. For his insomnia he was started on trazadone 150 mg q hs.  , Pt gradually stabilized with imprved sleep, appetite, mood-better no anxiety. No si/ hi.he was coping well and tol meds well  Mental Status at Discharge:Alertt, O/3, mood-good, affect-full. NNO SI ? HI. NO hallucinations/ delusions. Recent / Remote memory-good, Judgement / insight-good, concentration/ recall-good.  Lab Results: No results found for this or any previous visit (from the past 48 hour(s)).None Current outpatient prescriptions:aspirin 81 MG tablet, Take 81 mg by mouth daily., Disp: , Rfl: ;  buPROPion (WELLBUTRIN XL) 150 MG 24 hr tablet, Take 150 mg by mouth 2 (two) times daily., Disp: , Rfl: ;  clonazePAM (KLONOPIN) 1 MG tablet, Take 1 tablet (1 mg total) by mouth 2 (two) times daily., Disp: 60 tablet, Rfl: 0;  lisinopril (PRINIVIL,ZESTRIL) 20 MG tablet, Take 10 mg by mouth daily., Disp: , Rfl:  Multiple Vitamin (MULTIVITAMIN) tablet, Take 1 tablet by mouth daily., Disp: , Rfl: ;  Niacin-Simvastatin (SIMCOR) 1000-40 MG TB24, Take 1 tablet by mouth at bedtime. , Disp: , Rfl: ;  risperiDONE (RISPERDAL) 1 MG tablet, Take 1 tablet (1 mg total) by mouth at bedtime., Disp: 30 tablet, Rfl: 2;  traZODone (DESYREL) 150 MG tablet, Take 150 mg by mouth at bedtime., Disp: , Rfl:   Axis Diagnosis:   Axis I: Generalized Anxiety Disorder and Major Depression, Recurrent severe Axis II: Deferred Axis III:  Past Medical History   Diagnosis Date  . Sleep apnea     on CPAP  . Hypertension   . Hyperlipidemia   . Erectile dysfunction   . Insomnia   . Depression    Axis IV: economic problems, housing problems, occupational problems, other psychosocial or environmental problems, problems related to social environment and problems with primary support group Axis V: 61-70 mild symptoms   Level of Care:  OP  Discharge destination:  Home  Is patient on multiple antipsychotic therapies at discharge:  No    Has Patient had three or more failed trials of antipsychotic monotherapy by history:  No  Patient phone:  [phone] (home)  Patient address:   [address] [place] [address],   Follow-up recommendations:  Activity:  As tolerated Diet:  Regular Other:  Follow up with DR Clelia Croft for meds. and Wallie Char for therapy.  Comments:  Pt will continue Trazadone 150 mg q hs x 3 weeks, then to 100 mg q hs and 50 mg q hs. Pt will not be on night call for 1 month.  The patient received suicide prevention pamphlet:  Yes   Margit Banda 08/05/2012, 11:55 AM  [facility] 08/05/2012

## 2012-08-05 NOTE — Progress Notes (Signed)
Patient ID: Peter Briggs, male   DOB: 12/08/1955, 56 y.o.   MRN: [id number] D:  This is a 56 year old divorced caucasian male, who was referred per his therapist Gunnar Fusi [place], [facility]), treatment for depressive and anxiety symptoms. States he started decompensating June 03, 2012 and became suicidal.  He was referred here by his therapist Wallie Char who he saw for the first time a couple of weeks ago and felt he would benefit from IOP, though he is not sure why specifically.He states he is depressed. He has had multiple stressors in the past few months, including breaking up with his girlfriend, a divorce, some financial problems and is currently out of work per his therapist, because of his depression. Is living with his Mother, who is very supportive. He has eight children which are ages 21-14 yo and has a relationship with them. The 55 yo resides with him and he worries if he'll be able to take care of her financially. Stated he hadn't been sleeping for several weeks.  He was complaining of poor concentration and focus, low energy, poor sleep, fleeting and infrequent thoughts of suicide, no plan or intent.  Currently denies any SI/HI or A/V hallucinations.  Reports fair appetite, improved sleep, feeling more hopeful, and less depressed.  Continues to struggle with some anxiety.  States he wants to continue working on boundaries and Physiological scientist with his therapist Gunnar Fusi [place], [place]).  A:  D/C today.  F/U with Wallie Char, [facility] and PCP (Dr. Clelia Croft).  Pt will call for follow up appointments, per his request.  Encouraged support groups.  R:  Pt receptive.

## 2012-08-06 ENCOUNTER — Encounter (HOSPITAL_COMMUNITY): Payer: 59

## 2012-08-07 ENCOUNTER — Encounter (HOSPITAL_COMMUNITY): Payer: 59

## 2012-08-08 ENCOUNTER — Encounter (HOSPITAL_COMMUNITY): Payer: 59

## 2012-08-09 ENCOUNTER — Encounter (HOSPITAL_COMMUNITY): Payer: 59

## 2012-08-12 ENCOUNTER — Encounter (HOSPITAL_COMMUNITY): Payer: 59

## 2012-08-13 ENCOUNTER — Encounter (HOSPITAL_COMMUNITY): Payer: 59

## 2014-10-07 ENCOUNTER — Encounter: Payer: Self-pay | Admitting: Family Medicine

## 2014-10-07 ENCOUNTER — Ambulatory Visit (INDEPENDENT_AMBULATORY_CARE_PROVIDER_SITE_OTHER): Payer: 59 | Admitting: Family Medicine

## 2014-10-07 VITALS — BP 118/79 | HR 89 | Temp 98.8°F | Resp 20 | Ht 72.0 in | Wt 338.0 lb

## 2014-10-07 DIAGNOSIS — G4733 Obstructive sleep apnea (adult) (pediatric): Secondary | ICD-10-CM

## 2014-10-07 DIAGNOSIS — N183 Chronic kidney disease, stage 3 unspecified: Secondary | ICD-10-CM

## 2014-10-07 DIAGNOSIS — I10 Essential (primary) hypertension: Secondary | ICD-10-CM

## 2014-10-07 DIAGNOSIS — Z23 Encounter for immunization: Secondary | ICD-10-CM

## 2014-10-07 DIAGNOSIS — G4700 Insomnia, unspecified: Secondary | ICD-10-CM

## 2014-10-07 DIAGNOSIS — Z9989 Dependence on other enabling machines and devices: Secondary | ICD-10-CM

## 2014-10-07 DIAGNOSIS — E785 Hyperlipidemia, unspecified: Secondary | ICD-10-CM

## 2014-10-07 LAB — CBC WITH DIFFERENTIAL/PLATELET
BASOS ABS: 0.1 10*3/uL (ref 0.0–0.1)
Basophils Relative: 0.5 % (ref 0.0–3.0)
EOS ABS: 0.5 10*3/uL (ref 0.0–0.7)
Eosinophils Relative: 3.8 % (ref 0.0–5.0)
HCT: 53.1 % — ABNORMAL HIGH (ref 39.0–52.0)
Hemoglobin: 18.1 g/dL (ref 13.0–17.0)
LYMPHS PCT: 14.5 % (ref 12.0–46.0)
Lymphs Abs: 1.8 10*3/uL (ref 0.7–4.0)
MCHC: 34 g/dL (ref 30.0–36.0)
MCV: 87.7 fl (ref 78.0–100.0)
MONO ABS: 0.8 10*3/uL (ref 0.1–1.0)
Monocytes Relative: 6.6 % (ref 3.0–12.0)
NEUTROS PCT: 74.6 % (ref 43.0–77.0)
Neutro Abs: 9.2 10*3/uL — ABNORMAL HIGH (ref 1.4–7.7)
PLATELETS: 196 10*3/uL (ref 150.0–400.0)
RBC: 6.06 Mil/uL — AB (ref 4.22–5.81)
RDW: 14.1 % (ref 11.5–15.5)
WBC: 12.3 10*3/uL — AB (ref 4.0–10.5)

## 2014-10-07 LAB — COMPREHENSIVE METABOLIC PANEL
ALK PHOS: 71 U/L (ref 39–117)
ALT: 30 U/L (ref 0–53)
AST: 18 U/L (ref 0–37)
Albumin: 4.1 g/dL (ref 3.5–5.2)
BILIRUBIN TOTAL: 0.6 mg/dL (ref 0.2–1.2)
BUN: 37 mg/dL — ABNORMAL HIGH (ref 6–23)
CHLORIDE: 103 meq/L (ref 96–112)
CO2: 27 mEq/L (ref 19–32)
CREATININE: 1.74 mg/dL — AB (ref 0.40–1.50)
Calcium: 10.4 mg/dL (ref 8.4–10.5)
GFR: 43.14 mL/min — AB (ref >60.00)
Glucose, Bld: 83 mg/dL (ref 70–99)
Potassium: 4.4 mEq/L (ref 3.5–5.1)
Sodium: 139 mEq/L (ref 135–145)
Total Protein: 7.1 g/dL (ref 6.0–8.3)

## 2014-10-07 LAB — LIPID PANEL
CHOL/HDL RATIO: 5
CHOLESTEROL: 172 mg/dL (ref 0–200)
HDL: 36.5 mg/dL — AB (ref >39.00)
LDL CALC: 112 mg/dL — AB (ref 0–99)
NonHDL: 135.5
TRIGLYCERIDES: 120 mg/dL (ref 0.0–149.0)
VLDL: 24 mg/dL (ref 0.0–40.0)

## 2014-10-07 LAB — TSH: TSH: 2.06 u[IU]/mL (ref 0.35–4.50)

## 2014-10-07 NOTE — Progress Notes (Signed)
Office Note 10/07/2014  CC:  Chief Complaint  Patient presents with  . Establish Care    [place]   HPI:  Peter Briggs is a 59 y.o. White male who is here to establish care. Patient's most recent primary MD[place] [facility] Prior to that he had received some primary care in [place] area from a few MD's but no one with much consistency per pt.  Old records in EPIC/HL EMR were reviewed prior to or during today's visit.  His biggest complaint today is ongoing insomnia that has been less responsive to clonazepam 2mg  qhs and amitriptyline 25mg  qhs.  Goes to bed at same time every night, wakes up same time every morning.  Denies RLS.  Has anxiety, says his mind/worries won't shut off. Wears CPAP nightly, says sleep has been improved and he has had less daytime somnolence since getting on CPAP but he hesitates to say that CPAP has made a "huge" difference.  Reports compliance with bp meds but does not monitor bp at home. Past problem with depression noted, hx of hospitalization for suicide attempt a few years ago. Currently says his mood is fine on no psychotropic meds.  Past Medical History  Diagnosis Date  . OSA on CPAP     17 cm H20  . Hypertension   . Hyperlipidemia   . Erectile dysfunction   . Insomnia   . Depression 05/2012    H[facility]  . Arthritis   . Chronic renal insufficiency, stage III (moderate)   . COPD (chronic obstructive pulmonary disease)     [place] in [place] ([name])  . History of adenomatous polyp of colon   . Chronic tension headaches   . History of alcohol abuse     Remote past-binge drinker    Past Surgical History  Procedure Laterality Date  . Finger surgery    . Toe surgery Right     [name]; great toe MCP joint replacment  . Colonoscopy w/ polypectomy  2011 approx    recall 5 yrs  . Rotator cuff repair  Right 2011  . Uvulectomy      did not help OSA  . Tonsillectomy/adenoidectomy/turbinate reduction      did not help OSA    Family History  Problem Relation Age of Onset  . COPD Mother   . Asthma Daughter   . Sleep apnea Son   . Asthma Daughter   . Kidney disease Daughter   . Sleep apnea Son     History   Social History  . Marital Status: Divorced    Spouse Name: N/A  . Number of Children: N/A  . Years of Education: N/A   Occupational History  . Not on file.   Social History Main Topics  . Smoking status: Former Smoker    Types: Cigars  . Smokeless tobacco: Never Used  . Alcohol Use: No  . Drug Use: No  . Sexual Activity: Not Currently   Other Topics Concern  . Not on file   Social History Narrative   Divorced, 8 children, 14 grandchildren.   Or[place] area.     [place]   Occupation: disability (multifactorial medical problems) since 2014.   Used to be Engineer, siteHVAC technician (residential and commercial).   Tob: 20 pack-yr hx, quit 1995.     Hx  of alcohol abuse in the past; no alc since 2013.  Hx of marijuana use; quit 2010.    No other drug use.   MEDS: amitriptyline , 1 tab qhs, Astepro qd, Symbicort 160/4.5 2 puffs bid, clonazepam , 2 tabs po qhs, HCTZ  qd, lisinopril  qd, Mag-ox  1 tab qd, Pravastatin  qd  No Known Allergies  ROS Review of Systems  Constitutional: Positive for fatigue (chronic). Negative for fever.  HENT: Negative for congestion and sore throat.   Eyes: Negative for visual disturbance.  Respiratory: Negative for cough.   Cardiovascular: Negative for chest pain.  Gastrointestinal: Negative for nausea and abdominal pain.  Genitourinary: Negative for dysuria.  Musculoskeletal: Negative for back pain and joint swelling.  Skin: Negative for rash.  Neurological: Positive for headaches. Negative for weakness.  Hematological: Negative for adenopathy.   Psychiatric/Behavioral: Positive for sleep disturbance. Negative for dysphoric mood. The patient is nervous/anxious.     PE; Blood pressure 118/79, pulse 89, temperature 98.8 F (37.1 C), temperature source Temporal, resp. rate 20, height 6' (1.829 m), weight 338 lb (153.316 kg), SpO2 94 %.  BMI 46 Gen: Alert, well appearing, morbidly obese WM in NAD.  Patient is oriented to person, place, time, and situation. AFFECT: pleasant, lucid thought and speech. ZOX:WRUE: no injection, icteris, swelling, or exudate.  EOMI, PERRLA. Mouth: lips without lesion/swelling.  Oral mucosa pink and moist. Oropharynx without erythema, exudate, or swelling.  Neck - No masses or thyromegaly or limitation in range of motion CV: RRR, distant S1 and S2.  No m/r/g.   LUNGS: CTA bilat, nonlabored resps, good aeration in all lung fields. EXT: no clubbing or cyanosis.  He has 2+ pitting edema in both lower extremities from mid tibial level down into feet.  No rash or skin breakdown.   Pertinent labs:  None today  ASSESSMENT AND PLAN:   New pt; will obtain prior PCP's records.  1) Insomnia; not ideally controlled. No change in clonaz at this time.  I recommended he try increasing his amitriptyline to TWO of the  tabs qhs.  2) HTN; The current medical regimen is effective;  continue present plan and medications. Lytes/cr today.  3) Hyperlipidemia: tolerating statin. FLP and hepatic panel today.  4) CRI, stage III; check lytes/cr today as well as CBC.  An After Visit Summary was printed and given to the patient.  Return in about 2 weeks (around 10/21/2014) for f/u insomnia.  I need to make sure patient is taking  ASA qd at this f/u visit (it was not on his med list today and I forgot to ask him why).

## 2014-10-07 NOTE — Progress Notes (Signed)
Pre visit review using our clinic review tool, if applicable. No additional management support is needed unless otherwise documented below in the visit note. 

## 2014-10-09 ENCOUNTER — Telehealth: Payer: Self-pay | Admitting: Family Medicine

## 2014-10-09 ENCOUNTER — Encounter: Payer: Self-pay | Admitting: Family Medicine

## 2014-10-09 NOTE — Telephone Encounter (Signed)
emmi emailed °

## 2014-10-20 ENCOUNTER — Other Ambulatory Visit: Payer: Self-pay | Admitting: Family Medicine

## 2014-10-20 NOTE — Telephone Encounter (Signed)
Pls clarify with pt how he takes this med: it was my understanding that he took one tab at bedtime, which would make this RF too early.   Let me know what he says-thx

## 2014-10-20 NOTE — Telephone Encounter (Signed)
Rx request from [facility].  Per request his last fill date was 09/11/14 # 90.  Please advise rf.

## 2014-10-20 NOTE — Telephone Encounter (Signed)
Rx request for klonopin.  Sorry, I just realized I didn't put what kind of medication!

## 2014-10-21 ENCOUNTER — Encounter: Payer: Self-pay | Admitting: Family Medicine

## 2014-10-21 ENCOUNTER — Ambulatory Visit (INDEPENDENT_AMBULATORY_CARE_PROVIDER_SITE_OTHER): Payer: 59 | Admitting: Family Medicine

## 2014-10-21 VITALS — BP 123/82 | HR 76 | Temp 97.5°F | Resp 18 | Ht 72.0 in | Wt 338.0 lb

## 2014-10-21 DIAGNOSIS — F32A Depression, unspecified: Secondary | ICD-10-CM

## 2014-10-21 DIAGNOSIS — N183 Chronic kidney disease, stage 3 unspecified: Secondary | ICD-10-CM

## 2014-10-21 DIAGNOSIS — G4700 Insomnia, unspecified: Secondary | ICD-10-CM

## 2014-10-21 DIAGNOSIS — G4733 Obstructive sleep apnea (adult) (pediatric): Secondary | ICD-10-CM

## 2014-10-21 DIAGNOSIS — I10 Essential (primary) hypertension: Secondary | ICD-10-CM

## 2014-10-21 DIAGNOSIS — F329 Major depressive disorder, single episode, unspecified: Secondary | ICD-10-CM

## 2014-10-21 LAB — BASIC METABOLIC PANEL
BUN: 25 mg/dL — ABNORMAL HIGH (ref 6–23)
CO2: 30 mEq/L (ref 19–32)
Calcium: 9.4 mg/dL (ref 8.4–10.5)
Chloride: 102 mEq/L (ref 96–112)
Creatinine, Ser: 1.62 mg/dL — ABNORMAL HIGH (ref 0.40–1.50)
GFR: 46.84 mL/min — ABNORMAL LOW (ref >60.00)
Glucose, Bld: 72 mg/dL (ref 70–99)
Potassium: 4.6 mEq/L (ref 3.5–5.1)
Sodium: 138 mEq/L (ref 135–145)

## 2014-10-21 MED ORDER — AMITRIPTYLINE HCL 25 MG PO TABS: ORAL_TABLET | ORAL | Status: DC

## 2014-10-21 MED ORDER — CLONAZEPAM 1 MG PO TABS: 2.0000 mg | ORAL_TABLET | Freq: Every day | ORAL | Status: DC

## 2014-10-21 MED ORDER — ASPIRIN EC 81 MG PO TBEC
81.0000 mg | DELAYED_RELEASE_TABLET | Freq: Every day | ORAL | Status: AC
Start: 2014-10-21 — End: ?

## 2014-10-21 NOTE — Telephone Encounter (Signed)
Looked patient up on controlled website, looks like last Rx was dispensed 09/11/14 # 90 and when patient came into office today he stated that he took 2 QHS.  Please advise.

## 2014-10-21 NOTE — Telephone Encounter (Signed)
Handled during OV today'

## 2014-10-21 NOTE — Progress Notes (Signed)
Pre visit review using our clinic review tool, if applicable. No additional management support is needed unless otherwise documented below in the visit note. 

## 2014-10-21 NOTE — Patient Instructions (Signed)
Try to locate a copy of your sleep study and bring this to our office. Contact number for Behavioral Health: [phone]

## 2014-10-21 NOTE — Progress Notes (Signed)
OFFICE NOTE  10/21/2014 Not CC:  Chief Complaint  Patient presents with  . Follow-up   HPI: Patient is a 59 y.o. Caucasian male who is here for 2 wk f/u insomnia, CRI, also review of recent labs showing erythrocytosis (chronic).  Increasing amitriptyline to  qhs since last visit did not help per pt report today. No home bp monitoring to report.  Discussed general past history of depression--has even required hospitalization in the past for this. Not on med for this currently but admits he does feel mildly depressed on chronic basis.  Says has hx of multiple antidepressants in the past but not much has helped: he really made no effort to try to remember the names of any of them today when I asked for specific names.  Denies SI or HI.    Has hx of OSA and uses CPAP nightly.  We did discuss the fact that his hx of erythrocytosis indicates poorly controlled OSA until proven otherwise.  His CPAP setting is very high (17).  He thinks his sleep study was w/in the last 4-5 yrs but can't be sure, although he says he can try to track down the sleep study results so we can then get a resp care agency to do CPAP titration study on him.    His recent Cr was up to 1.74 compared to the last Cr in our EMR in 06/2012, which was 1.36. He says he was able to increase water intake since last labs as I had recommended.    Pertinent PMH:  Past medical, surgical, social, and family history reviewed and no changes are noted since last office visit.  MEDS:  Outpatient Prescriptions Prior to Visit  Medication Sig Dispense Refill  . amitriptyline (ELAVIL) 25 MG tablet Take 25 mg by mouth at bedtime.    Marland Kitchen azelastine (ASTELIN) 0.1 % nasal spray Place into both nostrils 2 (two) times daily. Use in each nostril as directed    . budesonide-formoterol (SYMBICORT) 160-4.5 MCG/ACT inhaler Inhale 2 puffs into the lungs 2 (two) times daily.    . hydrochlorothiazide (HYDRODIURIL) 25 MG tablet Take 25 mg by mouth daily.  1/2 tab po qd    . lisinopril (PRINIVIL,ZESTRIL) 20 MG tablet Take 10 mg by mouth daily.    . magnesium oxide (MAG-OX) 400 MG tablet Take 400 mg by mouth daily.    . pravastatin (PRAVACHOL) 40 MG tablet Take 40 mg by mouth daily.    . clonazePAM (KLONOPIN) 1 MG tablet Take 2 mg by mouth at bedtime.      No facility-administered medications prior to visit.    PE: Blood pressure 123/82, pulse 76, temperature 97.5 F (36.4 C), temperature source Temporal, resp. rate 18, height 6' (1.829 m), weight 338 lb (153.316 kg), SpO2 97 %. Gen: Alert, well appearing.  Patient is oriented to person, place, time, and situation. AFFECT: pleasant, lucid thought and speech. No further exam today.  LABS: none today RECENT:  Lab Results  Component Value Date   CHOL 172 10/07/2014   HDL 36.50* 10/07/2014   LDLCALC 112* 10/07/2014   TRIG 120.0 10/07/2014   CHOLHDL 5 10/07/2014     Chemistry      Component Value Date/Time   NA 139 10/07/2014 1129   K 4.4 10/07/2014 1129   CL 103 10/07/2014 1129   CO2 27 10/07/2014 1129   BUN 37* 10/07/2014 1129   CREATININE 1.74* 10/07/2014 1129   CREATININE 1.36* 07/24/2012 0707      Component Value  Date/Time   CALCIUM 10.4 10/07/2014 1129   ALKPHOS 71 10/07/2014 1129   AST 18 10/07/2014 1129   ALT 30 10/07/2014 1129   BILITOT 0.6 10/07/2014 1129     Lab Results  Component Value Date   WBC 12.3* 10/07/2014   HGB 18.1 Repeated and verified X2.* 10/07/2014   HCT 53.1 Repeated and verified X2.* 10/07/2014   MCV 87.7 10/07/2014   PLT 196.0 10/07/2014   Lab Results  Component Value Date   TSH 2.06 10/07/2014    IMPRESSION AND PLAN:  1) HTN, well controlled. Add ASA 81mg  qd for primary CV/CVA protection.  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 Cr up recently compared to 2013, likely increased baseline GFR rather than prerenal azotomia, but I wanted to make sure he maximized fluid intake as a general principle with this disease at  this point along the GFR continuum.  Will recheck BMET today to make sure things are stable compared to 2 wks ago. Avoid NSAIDs (except 81mg  ASA qd).    3) Depression: current and past; he is feeling like he wants to avoid trial of any meds at this time but knows he can contact me or return if he changes his mind.  4) OSA on CPAP, with labs over last few years showing mild erythrocytosis--indicating possible poorly controlled OSA.  Need copy of sleep study and if it was done in the last 5 yrs then we can ask local resp care company to do a home cpap titration study.  5) Insomnia: holding benzo dose stable b/c i don't want to depress resp drive any further. Will titrate amitriptyline again : this time up to 75mg  qhs.  An After Visit Summary was printed and given to the patient.  FOLLOW UP: 6 wks

## 2014-10-21 NOTE — Telephone Encounter (Signed)
Agree: handled during o/v today (RF printed and handed to pt).

## 2014-11-02 ENCOUNTER — Encounter: Payer: Self-pay | Admitting: Family Medicine

## 2014-11-02 ENCOUNTER — Telehealth: Payer: Self-pay | Admitting: Family Medicine

## 2014-11-02 DIAGNOSIS — G4733 Obstructive sleep apnea (adult) (pediatric): Secondary | ICD-10-CM

## 2014-11-02 DIAGNOSIS — Z9989 Dependence on other enabling machines and devices: Principal | ICD-10-CM

## 2014-11-02 NOTE — Telephone Encounter (Signed)
Pls notify pt that I got the sleep study report he sent. I recommend he get back in to see a sleep specialist so he can get re-evaluated regarding his obstructive sleep apnea.  The MD that interpreted his test in 08/2013 was [name] with [facility] in [place], so he will need a referral to a local pulmonologist/sleep medicine MD.  I recommend [facility].  Let me know what he says and I'll enter the appropriate order.

## 2014-11-03 NOTE — Telephone Encounter (Signed)
Patient notified. Patient stated that he is fine with seeing [facility]. He would like to have the home study.

## 2014-11-06 NOTE — Telephone Encounter (Signed)
Order to [facility] is entered. Pls fax their office the patient's sleep study I gave you.-thx

## 2014-11-09 ENCOUNTER — Encounter: Payer: Self-pay | Admitting: Family Medicine

## 2014-11-09 NOTE — Telephone Encounter (Signed)
Done

## 2014-12-02 ENCOUNTER — Ambulatory Visit: Payer: Self-pay | Admitting: Family Medicine

## 2014-12-04 ENCOUNTER — Ambulatory Visit: Payer: 59 | Admitting: Family Medicine

## 2014-12-04 ENCOUNTER — Ambulatory Visit: Payer: Self-pay | Admitting: Family Medicine

## 2014-12-23 ENCOUNTER — Encounter: Payer: Self-pay | Admitting: Family Medicine

## 2014-12-23 ENCOUNTER — Ambulatory Visit (INDEPENDENT_AMBULATORY_CARE_PROVIDER_SITE_OTHER): Payer: 59 | Admitting: Family Medicine

## 2014-12-23 ENCOUNTER — Telehealth: Payer: Self-pay | Admitting: Family Medicine

## 2014-12-23 VITALS — BP 128/85 | HR 92 | Temp 97.2°F | Resp 20 | Ht 72.0 in | Wt 347.0 lb

## 2014-12-23 DIAGNOSIS — D72829 Elevated white blood cell count, unspecified: Secondary | ICD-10-CM | POA: Diagnosis not present

## 2014-12-23 DIAGNOSIS — I10 Essential (primary) hypertension: Secondary | ICD-10-CM

## 2014-12-23 DIAGNOSIS — N183 Chronic kidney disease, stage 3 unspecified: Secondary | ICD-10-CM

## 2014-12-23 DIAGNOSIS — D751 Secondary polycythemia: Secondary | ICD-10-CM | POA: Diagnosis not present

## 2014-12-23 DIAGNOSIS — Z9989 Dependence on other enabling machines and devices: Secondary | ICD-10-CM

## 2014-12-23 DIAGNOSIS — G4700 Insomnia, unspecified: Secondary | ICD-10-CM

## 2014-12-23 DIAGNOSIS — G4733 Obstructive sleep apnea (adult) (pediatric): Secondary | ICD-10-CM

## 2014-12-23 LAB — CBC WITH DIFFERENTIAL/PLATELET
BASOS ABS: 0.1 10*3/uL (ref 0.0–0.1)
Basophils Relative: 1.2 % (ref 0.0–3.0)
EOS ABS: 0.5 10*3/uL (ref 0.0–0.7)
EOS PCT: 4.8 % (ref 0.0–5.0)
HEMATOCRIT: 51.6 % (ref 39.0–52.0)
HEMOGLOBIN: 17.7 g/dL — AB (ref 13.0–17.0)
LYMPHS ABS: 1.5 10*3/uL (ref 0.7–4.0)
Lymphocytes Relative: 15.3 % (ref 12.0–46.0)
MCHC: 34.3 g/dL (ref 30.0–36.0)
MCV: 87 fl (ref 78.0–100.0)
MONOS PCT: 8 % (ref 3.0–12.0)
Monocytes Absolute: 0.8 10*3/uL (ref 0.1–1.0)
Neutro Abs: 6.7 10*3/uL (ref 1.4–7.7)
Neutrophils Relative %: 70.7 % (ref 43.0–77.0)
Platelets: 185 10*3/uL (ref 150.0–400.0)
RBC: 5.93 Mil/uL — ABNORMAL HIGH (ref 4.22–5.81)
RDW: 14.5 % (ref 11.5–15.5)
WBC: 9.5 10*3/uL (ref 4.0–10.5)

## 2014-12-23 LAB — BASIC METABOLIC PANEL
BUN: 19 mg/dL (ref 6–23)
CO2: 28 mEq/L (ref 19–32)
Calcium: 9.9 mg/dL (ref 8.4–10.5)
Chloride: 100 mEq/L (ref 96–112)
Creatinine, Ser: 1.63 mg/dL — ABNORMAL HIGH (ref 0.40–1.50)
GFR: 46.48 mL/min — ABNORMAL LOW (ref >60.00)
Glucose, Bld: 93 mg/dL (ref 70–99)
POTASSIUM: 3.9 meq/L (ref 3.5–5.1)
SODIUM: 137 meq/L (ref 135–145)

## 2014-12-23 NOTE — Telephone Encounter (Signed)
Dr. Milinda CaveMcGowen asked pt. to call with the dosage of his lisinopril. He has 40 mg tablets and he breaks them in 1/2 and takes one a day.

## 2014-12-23 NOTE — Addendum Note (Signed)
Addended by: Jeoffrey MassedMCGOWEN, Zacchary Pompei H on: 12/23/2014 05:03 PM   Modules accepted: Orders, Medications

## 2014-12-23 NOTE — Progress Notes (Signed)
Pre visit review using our clinic review tool, if applicable. No additional management support is needed unless otherwise documented below in the visit note. 

## 2014-12-23 NOTE — Telephone Encounter (Signed)
Noted  

## 2014-12-23 NOTE — Progress Notes (Addendum)
OFFICE NOTE  12/23/2014  CC:  Chief Complaint  Patient presents with  . Follow-up    fasting     HPI: Patient is a 59 y.o. Caucasian male who is here for 2 mo f/u HTN, insomnia, CRI, polycythemia. Pt has appt with pulm 12/31/14 (Dr. 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 Reports no significant change in sleep patterns--"always have trouble, always feel tired"--since increase of amitrip to 75mg  qhs last visit.  Reports hx of orthostatic dizziness, no falls. Unclear what strength lisinopril he is taking: I have on record that he takes 1/2 of 20mg  lisinopril tab, but in med rec section of chart it shows he filled a rx via [facility] for lisin 40mg  tab on 12/03/14. Has been trying to drink more fluids/water: says he drinks lots of water "all the time". He takes no OTC supplements.  Pertinent PMH:  Past medical, surgical, social, and family history reviewed and no changes are noted since last office visit.  MEDS:  Outpatient Prescriptions Prior to Visit  Medication Sig Dispense Refill  . amitriptyline (ELAVIL) 25 MG tablet 3 tabs po qhs 90 tablet 6  . aspirin EC 81 MG tablet Take 1 tablet (81 mg total) by mouth daily. 30 tablet 0  . azelastine (ASTELIN) 0.1 % nasal spray Place into both nostrils 2 (two) times daily. Use in each nostril as directed    . budesonide-formoterol (SYMBICORT) 160-4.5 MCG/ACT inhaler Inhale 2 puffs into the lungs 2 (two) times daily.    . clonazePAM (KLONOPIN) 1 MG tablet Take 2 tablets (2 mg total) by mouth at bedtime. 60 tablet 5  . hydrochlorothiazide (HYDRODIURIL) 25 MG tablet Take 25 mg by mouth daily. 1/2 tab po qd    . lisinopril (PRINIVIL,ZESTRIL) 20 MG tablet Take 10 mg by mouth daily.    . magnesium oxide (MAG-OX) 400 MG tablet Take 400 mg by mouth daily.    . pravastatin (PRAVACHOL) 40 MG tablet Take 40 mg by mouth daily.     No facility-administered medications prior to visit.    PE: Blood pressure 128/85, pulse 92,  temperature 97.2 F (36.2 C), temperature source Temporal, resp. rate 20, height 6' (1.829 m), weight 347 lb (157.398 kg), SpO2 95 %. Gen: Alert, well appearing.  Patient is oriented to person, place, time, and situation. AFFECT: pleasant, lucid thought and speech. No further exam today.  LABS:    Chemistry      Component Value Date/Time   NA 138 10/21/2014 1139   K 4.6 10/21/2014 1139   CL 102 10/21/2014 1139   CO2 30 10/21/2014 1139   BUN 25* 10/21/2014 1139   CREATININE 1.62* 10/21/2014 1139   CREATININE 1.36* 07/24/2012 0707      Component Value Date/Time   CALCIUM 9.4 10/21/2014 1139   ALKPHOS 71 10/07/2014 1129   AST 18 10/07/2014 1129   ALT 30 10/07/2014 1129   BILITOT 0.6 10/07/2014 1129     Lab Results  Component Value Date   WBC 12.3* 10/07/2014   HGB 18.1 Repeated and verified X2.* 10/07/2014   HCT 53.1 Repeated and verified X2.* 10/07/2014   MCV 87.7 10/07/2014   PLT 196.0 10/07/2014     IMPRESSION AND PLAN:  1) HTN; The current medical regimen is effective;  continue present plan and medications. Clarify lisin tab strength.  2) CRI, stage 3, repeat BMET today.  3) OSA w/ cpap use: he has hx of polycythemia that I suspect is secondary to poorly controlled  OSA. He reports compliance with CPAP nightly.  He has pulm eval early next month to look into this. Repeat CBC today.  4) Insomnia: fairly stable.  No changes in meds today.  An After Visit Summary was printed and given to the patient.  FOLLOW UP: 4 mo  ADDENDUM: pt called back after going home today and said his lisinopril is  tab and he takes 1/2 tab daily.-PM

## 2014-12-23 NOTE — Patient Instructions (Signed)
Please call our office [phone]) and report the strength of the lisinopril pill that you currently take.

## 2014-12-27 HISTORY — PX: TRANSTHORACIC ECHOCARDIOGRAM: SHX275

## 2014-12-28 ENCOUNTER — Ambulatory Visit (INDEPENDENT_AMBULATORY_CARE_PROVIDER_SITE_OTHER): Payer: 59 | Admitting: Pulmonary Disease

## 2014-12-28 ENCOUNTER — Encounter: Payer: Self-pay | Admitting: Pulmonary Disease

## 2014-12-28 VITALS — BP 140/82 | HR 96 | Ht 72.0 in | Wt 351.0 lb

## 2014-12-28 DIAGNOSIS — N183 Chronic kidney disease, stage 3 unspecified: Secondary | ICD-10-CM

## 2014-12-28 DIAGNOSIS — R609 Edema, unspecified: Secondary | ICD-10-CM

## 2014-12-28 DIAGNOSIS — G4733 Obstructive sleep apnea (adult) (pediatric): Secondary | ICD-10-CM | POA: Diagnosis not present

## 2014-12-28 DIAGNOSIS — R600 Localized edema: Secondary | ICD-10-CM | POA: Insufficient documentation

## 2014-12-28 DIAGNOSIS — Z9989 Dependence on other enabling machines and devices: Principal | ICD-10-CM

## 2014-12-28 MED ORDER — FUROSEMIDE 40 MG PO TABS: 40.0000 mg | ORAL_TABLET | ORAL | Status: DC

## 2014-12-28 NOTE — Assessment & Plan Note (Addendum)
You have severe obstructive sleep apnea  We will ask DME to provide you with CPAP supplies, send us a report on your machine  & check your oxygen level during sleep Is possible that nocturnal hypoxia is the cause of his polycythemia, we'll check overnight nocturnal oximetry on CPAP and room air. Weight loss encouraged, compliance with goal of at least 4-6 hrs every night is the expectation. Advised against medications with sedative side effects Cautioned against driving when sleepy - understanding that sleepiness will vary on a day to day basis

## 2014-12-28 NOTE — Assessment & Plan Note (Signed)
Unclear cause-check echo to clarify Start Lasix instead of thiazide for 2 weeks Recheck renal function within 2 weeks

## 2014-12-28 NOTE — Patient Instructions (Signed)
You have severe obstructive sleep apnea  We will ask DME to provide you with CPAP supplies, send us a report on your machine  & check your oxygen level during sleep Start lasix 40 mg once daily am x 2 wee ks  - If this does not cause you to urinate more -call us back in 3 days to report Contact Dr Jama FlavorsMcGown's office in 1-2 weeks to recheck kidney function on lasix Check echo for heart function

## 2014-12-28 NOTE — Assessment & Plan Note (Signed)
Due to bipedal edema, Start lasix 40 mg once daily am x 2 wee ks  - If this does not cause you to urinate more -call us back in 3 days to report Contact Dr Jama FlavorsMcGown's office in 1-2 weeks to recheck kidney function on lasix Check echo for heart function

## 2014-12-28 NOTE — Progress Notes (Signed)
Subjective:    Patient ID: Peter Briggs, male    DOB: 1956/06/18, 59 y.o.   MRN: [id number]  HPI  Chief Complaint  Patient presents with  . Sleep Consult    Referred by Dr. Marvel PlanMcGowan.  Sleep Study 09/05/2013; currently uses CPAP machine pressure setting 17.  [place] through company in [place].  always tired, never feels rested.  Epworth Score: 430   59 year old morbidly obese man presents for evaluation of OSA and polycythemia. He underwent UPPP around 1998 by Dr. Haroldine Lawsrossley. This did not help his symptoms. He obtained his CPAP machine close to thousand 10 and this really helped improve his sleep and give him better rest. He suffered a bout of depression and motorway for a few years. 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 PSG 08/2013-weight 320 pounds-showed total sleep time 342 minutes, AHI 104 per hour with lowest desaturation 67% and severe PLM's. He was subsequently placed on a CPAP machine, and per his report is maintained on 17 cm with a nasal mask and chin strap due to leak noted on her download. He seems to have EPR set at 3 cm based on his report.  He is gained 30 pounds in the last 2 years Epworth sleepiness score is 0. Bedtime is around 9 PM, sleep latency about 30 minutes, he sleeps on his left side or his back with one pillow reports at least 2 nocturnal awakenings and is out of bed by 10 AM feeling tired without dryness of mouth or headaches. He takes 75 mg of Elavil and reports dizziness and tiredness. He smoked less than 15 pack years and now smokes a cigar on occasion He has CK D-creatinine noted to be 1.6 in 11/2014. Hemoglobin  17.7  He also reports worsening pedal edema for the past few weeks. He lives with his daughter. CXR 05/2012 does not show any infiltrates or effusions I have reviewed all relevant imaging, labs & test data  Past Medical History  Diagnosis Date  . OSA on CPAP     17 cm H20 (Severe OSA with hypoxia down to 67% on sleep study  [facility]05[facility], [name])  . Hypertension   . Hyperlipidemia   . Erectile dysfunction   . Insomnia   . Depression 05/2012    Hx of [facility] for [facility] attempt  . Arthritis   . Chronic renal insufficiency, stage III (moderate)     CrCl 40s-50s  . COPD (chronic obstructive pulmonary disease)     [place] ([name])  . History of adenomatous polyp of colon   . Chronic tension headaches   . History of alcohol abuse     Remote past-binge drinker   No Known Allergies  History   Social History  . Marital Status: Divorced    Spouse Name: N/A  . Number of Children: N/A  . Years of Education: N/A   Occupational History  . Not on file.   Social History Main Topics  . Smoking status: Former Smoker    Types: Cigars  . Smokeless tobacco: Never Used  . Alcohol Use: No  . Drug Use: No  . Sexual Activity: Not Currently   Other Topics Concern  . Not on file   Social History Narrative   Divorced, 8 children, 14 grandchildren.   Orig from [place] area.     [place],  returned to [facility] 07/2014.   Occupation: disability (multifactorial medical problems) since 2014.   Used to be Engineer, site (residential and commercial).   Tob: 20 pack-yr hx, quit 1995.     Hx of alcohol abuse in the past; no alc since 2013.  Hx of marijuana use; quit 2010.    No other drug use.    Family History  Problem Relation Age of Onset  . COPD Mother   . Asthma Daughter   . Sleep apnea Son   . Asthma Daughter   . Kidney disease Daughter   . Sleep apnea Son     Review of Systems  Constitutional: Negative for fever, chills, activity change, appetite change and unexpected weight change.  HENT: Negative for congestion, dental problem, postnasal drip, rhinorrhea, sneezing, sore throat, trouble swallowing and voice change.   Eyes: Negative for visual disturbance.  Respiratory:  Negative for cough, choking and shortness of breath.   Cardiovascular: Negative for chest pain and leg swelling.  Gastrointestinal: Negative for nausea, vomiting and abdominal pain.  Genitourinary: Negative for difficulty urinating.  Musculoskeletal: Negative for arthralgias.  Skin: Negative for rash.  Psychiatric/Behavioral: Negative for behavioral problems and confusion.       Objective:   Physical Exam   Gen. Pleasant, obese, in no distress, normal affect ENT - no lesions, no post nasal drip, class 2-3 airway Neck: No JVD, no thyromegaly, no carotid bruits Lungs: no use of accessory muscles, no dullness to percussion, decreased without rales or rhonchi  Cardiovascular: Rhythm regular, heart sounds  normal, no murmurs or gallops, 2+ peripheral edema Abdomen: soft and non-tender, no hepatosplenomegaly, BS normal. Musculoskeletal: No deformities, no cyanosis or clubbing Neuro:  alert, non focal, no tremors       Assessment & Plan:

## 2014-12-29 ENCOUNTER — Telehealth: Payer: Self-pay | Admitting: Pulmonary Disease

## 2014-12-29 NOTE — Telephone Encounter (Signed)
Relayed below to patient.  Nothing further needed.

## 2014-12-29 NOTE — Telephone Encounter (Signed)
Per 12/28/14 OV: Pedal edema - Peter Milchakesh Alva V, MD at 12/28/2014  5:12 PM       Status: Written Related Problem: Pedal edema    Expand All Collapse All   Unclear cause-check echo to clarify Start Lasix instead of thiazide for 2 weeks Recheck renal function within 2 weeks    ---  [facility]

## 2014-12-30 ENCOUNTER — Encounter: Payer: Self-pay | Admitting: Family Medicine

## 2015-01-04 ENCOUNTER — Other Ambulatory Visit (HOSPITAL_COMMUNITY): Payer: 59

## 2015-01-05 ENCOUNTER — Other Ambulatory Visit: Payer: Self-pay | Admitting: Pulmonary Disease

## 2015-01-05 ENCOUNTER — Telehealth: Payer: Self-pay | Admitting: Pulmonary Disease

## 2015-01-05 DIAGNOSIS — G4733 Obstructive sleep apnea (adult) (pediatric): Secondary | ICD-10-CM

## 2015-01-05 NOTE — Telephone Encounter (Signed)
Patient notified via voicemail that order has been entered and he will receive call to schedule. Nothing further needed.

## 2015-01-06 ENCOUNTER — Ambulatory Visit (HOSPITAL_COMMUNITY): Payer: 59 | Attending: Cardiology

## 2015-01-06 ENCOUNTER — Other Ambulatory Visit: Payer: Self-pay

## 2015-01-06 DIAGNOSIS — R609 Edema, unspecified: Secondary | ICD-10-CM | POA: Insufficient documentation

## 2015-01-06 DIAGNOSIS — N183 Chronic kidney disease, stage 3 unspecified: Secondary | ICD-10-CM

## 2015-01-06 DIAGNOSIS — I10 Essential (primary) hypertension: Secondary | ICD-10-CM | POA: Diagnosis not present

## 2015-01-06 DIAGNOSIS — E785 Hyperlipidemia, unspecified: Secondary | ICD-10-CM | POA: Diagnosis not present

## 2015-01-06 DIAGNOSIS — R931 Abnormal findings on diagnostic imaging of heart and coronary circulation: Secondary | ICD-10-CM

## 2015-01-08 ENCOUNTER — Telehealth: Payer: Self-pay | Admitting: Pulmonary Disease

## 2015-01-08 ENCOUNTER — Ambulatory Visit (INDEPENDENT_AMBULATORY_CARE_PROVIDER_SITE_OTHER): Payer: 59 | Admitting: Cardiology

## 2015-01-08 ENCOUNTER — Ambulatory Visit: Payer: Self-pay | Admitting: Cardiology

## 2015-01-08 ENCOUNTER — Encounter: Payer: Self-pay | Admitting: Cardiology

## 2015-01-08 VITALS — BP 100/68 | HR 91 | Ht 72.0 in | Wt 343.8 lb

## 2015-01-08 DIAGNOSIS — I5032 Chronic diastolic (congestive) heart failure: Secondary | ICD-10-CM

## 2015-01-08 DIAGNOSIS — E669 Obesity, unspecified: Secondary | ICD-10-CM | POA: Diagnosis not present

## 2015-01-08 DIAGNOSIS — R5383 Other fatigue: Secondary | ICD-10-CM

## 2015-01-08 HISTORY — DX: Chronic diastolic (congestive) heart failure: I50.32

## 2015-01-08 MED ORDER — FUROSEMIDE 40 MG PO TABS: 40.0000 mg | ORAL_TABLET | ORAL | Status: DC

## 2015-01-08 NOTE — Telephone Encounter (Signed)
[name] returning call said that she called pt and made appointment can be reached [phone].Caren GriffinsStanley A Dalton

## 2015-01-08 NOTE — Telephone Encounter (Signed)
Spoke with [name] today and she said that they are calling patient today to schedule his ONO.

## 2015-01-08 NOTE — Telephone Encounter (Signed)
lmtcb

## 2015-01-08 NOTE — Patient Instructions (Signed)
Medication Instructions:  Your physician has recommended you make the following change in your medication:  1) Take an additional 20 mg Lasix daily AS NEEDED for edema.  Labwork: None  Testing/Procedures: Your physician has requested that you have a lexiscan myoview. For further information please visit [email or web address]. Please follow instruction sheet, as given.  Follow-Up: Your physician wants you to follow-up in: 3 months with Dr. Mayford Knifeurner. You will receive a reminder letter in the mail two months in advance. If you don't receive a letter, please call our office to schedule the follow-up appointment.   Any Other Special Instructions Will Be Listed Below (If Applicable). Increase your exercise!  Decrease your portion sizes and follow a 2 gram SALT diet.  Low-Sodium Eating Plan Sodium raises blood pressure and causes water to be held in the body. Getting less sodium from food will help lower your blood pressure, reduce any swelling, and protect your heart, liver, and kidneys. We get sodium by adding salt (sodium chloride) to food. Most of our sodium comes from canned, boxed, and frozen foods. Restaurant foods, fast foods, and pizza are also very high in sodium. Even if you take medicine to lower your blood pressure or to reduce fluid in your body, getting less sodium from your food is important. WHAT IS MY PLAN? Most people should limit their sodium intake to 2,300 mg a day. Your health care provider recommends that you limit your sodium intake to 2 GRAMS a day.  WHAT DO I NEED TO KNOW ABOUT THIS EATING PLAN? For the low-sodium eating plan, you will follow these general guidelines:  Choose foods with a % Daily Value for sodium of less than 5% (as listed on the food label).   Use salt-free seasonings or herbs instead of table salt or sea salt.   Check with your health care provider or pharmacist before using salt substitutes.   Eat fresh foods.  Eat more vegetables and  fruits.  Limit canned vegetables. If you do use them, rinse them well to decrease the sodium.   Limit cheese to 1 oz (28 g) per day.   Eat lower-sodium products, often labeled as "lower sodium" or "no salt added."  Avoid foods that contain monosodium glutamate (MSG). MSG is sometimes added to [place] food and some canned foods.  Check food labels (Nutrition Facts labels) on foods to learn how much sodium is in one serving.  Eat more home-cooked food and less restaurant, buffet, and fast food.  When eating at a restaurant, ask that your food be prepared with less salt or none, if possible.  HOW DO I READ FOOD LABELS FOR SODIUM INFORMATION? The Nutrition Facts label lists the amount of sodium in one serving of the food. If you eat more than one serving, you must multiply the listed amount of sodium by the number of servings. Food labels may also identify foods as:  Sodium free--Less than 5 mg in a serving.  Very low sodium--35 mg or less in a serving.  Low sodium--140 mg or less in a serving.  Light in sodium--50% less sodium in a serving. For example, if a food that usually has 300 mg of sodium is changed to become light in sodium, it will have 150 mg of sodium.  Reduced sodium--25% less sodium in a serving. For example, if a food that usually has 400 mg of sodium is changed to reduced sodium, it will have 300 mg of sodium. WHAT FOODS CAN I EAT? Grains Low-sodium cereals, including oats,  puffed wheat and rice, and shredded wheat cereals. Low-sodium crackers. Unsalted rice and pasta. Lower-sodium bread.  Vegetables Frozen or fresh vegetables. Low-sodium or reduced-sodium canned vegetables. Low-sodium or reduced-sodium tomato sauce and paste. Low-sodium or reduced-sodium tomato and vegetable juices.  Fruits Fresh, frozen, and canned fruit. Fruit juice.  Meat and Other Protein Products Low-sodium canned tuna and salmon. Fresh or frozen meat, poultry, seafood, and fish.  Lamb. Unsalted nuts. Dried beans, peas, and lentils without added salt. Unsalted canned beans. Homemade soups without salt. Eggs.  Dairy Milk. Soy milk. Ricotta cheese. Low-sodium or reduced-sodium cheeses. Yogurt.  Condiments Fresh and dried herbs and spices. Salt-free seasonings. Onion and garlic powders. Low-sodium varieties of mustard and ketchup. Lemon juice.  Fats and Oils Reduced-sodium salad dressings. Unsalted butter.  Other Unsalted popcorn and pretzels.  The items listed above may not be a complete list of recommended foods or beverages. Contact your dietitian for more options. WHAT FOODS ARE NOT RECOMMENDED? Grains Instant hot cereals. Bread stuffing, pancake, and biscuit mixes. Croutons. Seasoned rice or pasta mixes. Noodle soup cups. Boxed or frozen macaroni and cheese. Self-rising flour. Regular salted crackers. Vegetables Regular canned vegetables. Regular canned tomato sauce and paste. Regular tomato and vegetable juices. Frozen vegetables in sauces. Salted french fries. Olives. Rosita FirePickles. Relishes. Sauerkraut. Salsa. Meat and Other Protein Products Salted, canned, smoked, spiced, or pickled meats, seafood, or fish. Bacon, ham, sausage, hot dogs, corned beef, chipped beef, and packaged luncheon meats. Salt pork. Jerky. Pickled herring. Anchovies, regular canned tuna, and sardines. Salted nuts. Dairy Processed cheese and cheese spreads. Cheese curds. Blue cheese and cottage cheese. Buttermilk.  Condiments Onion and garlic salt, seasoned salt, table salt, and sea salt. Canned and packaged gravies. Worcestershire sauce. Tartar sauce. Barbecue sauce. Teriyaki sauce. Soy sauce, including reduced sodium. Steak sauce. Fish sauce. Oyster sauce. Cocktail sauce. Horseradish. Regular ketchup and mustard. Meat flavorings and tenderizers. Bouillon cubes. Hot sauce. Tabasco sauce. Marinades. Taco seasonings. Relishes. Fats and Oils Regular salad dressings. Salted butter.  Margarine. Ghee. Bacon fat.  Other Potato and tortilla chips. Corn chips and puffs. Salted popcorn and pretzels. Canned or dried soups. Pizza. Frozen entrees and pot pies.  The items listed above may not be a complete list of foods and beverages to avoid. Contact your dietitian for more information. Document Released: 02/03/2002 Document Revised: 08/19/2013 Document Reviewed: 06/18/2013 [facility] Patient Information 2015 [employer] [place]. This information is not intended to replace advice given to you by your health care provider. Make sure you discuss any questions you have with your health care provider.

## 2015-01-08 NOTE — Progress Notes (Signed)
Cardiology Office Note   Date:  01/08/2015   ID:  Peter NipChristopher C Offerdahl, DOB [birth year], MRN [id number]  PCP:  Jeoffrey MassedMCGOWEN,[name], MD    Chief Complaint  Patient presents with  . New Evaluation    abnormal ECHO      History of Present Illness: Peter Briggs is a 59 y.o. male who presents for evaluation of.  He is followed by Dr. Vassie LollAlva for OSA and polycythemia.  He recently was complaining of LE edema and was started on Lasix. He underwent echo showing normal LVF with grade 2 diastolic dysfunction. He is now referred to Cardiology for further evaluation.  He has chronic SOB due to COPD that is stable.  He denies any chest pain or pressure.  He says that he is chronically fatigued with headaches.  He is on CPAP therapy and has an overnight pulse oximetry study pending to make sure he does not have oxygen desaturations.  Since going on the Lasix the LE edema has improved.  He says that he is having problems with exertional fatigue and cannot do the things he used to.  Of note, he has gained over 100lbs in the past 2 years.    Past Medical History  Diagnosis Date  . OSA on CPAP     17 cm H20 (Severe OSA with hypoxia down to 67% on sleep study 09/05/13 [facility], [name])  . Hypertension   . Hyperlipidemia   . Erectile dysfunction   . Insomnia   . Depression 05/2012    Hx of [facility]  . Arthritis   . Chronic renal insufficiency, stage III (moderate)     CrCl 40s-50s  . COPD (chronic obstructive pulmonary disease)     [place] ([name])  . History of adenomatous polyp of colon   . Chronic tension headaches   . History of alcohol abuse     Remote past-binge drinker  . Polycythemia     Past Surgical History  Procedure Laterality Date  . Finger surgery    . Toe surgery Right     [name]; great toe MCP joint replacment  . Colonoscopy w/  polypectomy  2011 approx    recall 5 yrs  . Rotator cuff repair Right 2011  . Uvulectomy      did not help OSA  . Tonsillectomy/adenoidectomy/turbinate reduction      did not help OSA     Current Outpatient Prescriptions  Medication Sig Dispense Refill  . amitriptyline (ELAVIL) 25 MG tablet 3 tabs po qhs 90 tablet 6  . aspirin EC 81 MG tablet Take 1 tablet (81 mg total) by mouth daily. 30 tablet 0  . azelastine (ASTELIN) 0.1 % nasal spray Place into both nostrils 2 (two) times daily. Use in each nostril as directed    . budesonide-formoterol (SYMBICORT) 160-4.5 MCG/ACT inhaler Inhale 2 puffs into the lungs 2 (two) times daily.    . clonazePAM (KLONOPIN) 1 MG tablet Take 2 tablets (2 mg total) by mouth at bedtime. 60 tablet 5  . furosemide (LASIX) 40 MG tablet Take 1 tablet (40 mg total) by mouth every morning. 20 tablet 0  . lisinopril (PRINIVIL,ZESTRIL) 40 MG tablet Take 20 mg by mouth daily.    . magnesium oxide (MAG-OX) 400 MG tablet Take 400 mg by mouth daily.    . pravastatin (PRAVACHOL) 40 MG tablet Take 40 mg by  mouth daily.     No current facility-administered medications for this visit.    Allergies:   Review of patient's allergies indicates no known allergies.    Social History:  The patient  reports that he has quit smoking. His smoking use included Cigars. He has never used smokeless tobacco. He reports that he does not drink alcohol or use illicit drugs.   Family History:  The patient's family history includes Asthma in his daughter and daughter; COPD in his mother; Kidney disease in his daughter; Sleep apnea in his son and son.    ROS:  Please see the history of present illness.   Otherwise, review of systems are positive for none.   All other systems are reviewed and negative.    PHYSICAL EXAM: VS:  BP 100/68 mmHg  Pulse 91  Ht 6' (1.829 m)  Wt 343 lb 12.8 oz (155.947 kg)  BMI 46.62 kg/m2 , BMI Body mass index is 46.62 kg/(m^2). GEN: Well nourished, well  developed, in no acute distress HEENT: normal Neck: no JVD, carotid bruits, or masses Cardiac: RRR; no murmurs, rubs, or gallops,no edema  Respiratory:  clear to auscultation bilaterally, normal work of breathing GI: soft, nontender, nondistended, + BS MS: no deformity or atrophy Skin: warm and dry, no rash Neuro:  Strength and sensation are intact Psych: euthymic mood, full affect   EKG:  EKG is not ordered today. The ekg ordered today demonstrates NSR with no ST changes   Recent Labs: 10/07/2014: ALT 30; TSH 2.06 12/23/2014: BUN 19; Creatinine 1.63*; Hemoglobin 17.7*; Platelets 185.0; Potassium 3.9; Sodium 137    Lipid Panel    Component Value Date/Time   CHOL 172 10/07/2014 1129   TRIG 120.0 10/07/2014 1129   HDL 36.50* 10/07/2014 1129   CHOLHDL 5 10/07/2014 1129   VLDL 24.0 10/07/2014 1129   LDLCALC 112* 10/07/2014 1129      Wt Readings from Last 3 Encounters:  01/08/15 343 lb 12.8 oz (155.947 kg)  12/28/14 351 lb (159.213 kg)  12/23/14 347 lb (157.398 kg)        ASSESSMENT AND PLAN:  1.  Chronic diastolic CHF.  2D echo showed normal LVF with grade II diastolic dysfunction.  He is currently on Lasix.  He occasionally will use added salt and eats out a lot.  I have instructed him on a 2gm sodium diet.  We also discussed the need to cut back on portion sizes, avoid eating out and exercise.  His exercise is limited by arthritis but I suggested trying a stationary bike.    I instructed him that he could take an extra dose of Lasix as needed for increased edema. 2.  Morbid obesity 3.  HTN - well controlled.   4.  Exertional fatigue.  This is most likely related to morbid obesity and OSA but need to rule out CAD. Will get a 2 day lexiscan myoview to rule out ischemia.  He cannot walk on ETT due to arthritis.   Current medicines are reviewed at length with the patient today.  The patient does not have concerns regarding medicines.  The following changes have been made:   no change  Labs/ tests ordered today include: see above assessment and plan No orders of the defined types were placed in this encounter.     Disposition:   FU with me in 3 months   Signed, Quintella ReichertURNER,TRACI R, MD  01/08/2015 9:32 AM    [facility] [address],  [place], [place]  [address] Phone: [phone]; Fax: [phone]

## 2015-01-08 NOTE — Telephone Encounter (Signed)
lmtcb x1 for [name] with [facility].  Pt is aware that we are working on this and will keep him updated. He agreed.

## 2015-01-08 NOTE — Telephone Encounter (Signed)
Pt calling back , said that michelle had caleed him and wanted to know if he had heard anything [facility] and he had not, told him that we were waiting on a call from [facility] and that we would contact him once we had spoken to her and he seemed ok with this.Caren GriffinsStanley A Briggs

## 2015-01-08 NOTE — Telephone Encounter (Signed)
Waiting on call back from [facility]

## 2015-01-08 NOTE — Telephone Encounter (Signed)
LM for Peter Briggs to return call on Monday 01/11/15

## 2015-01-11 NOTE — Telephone Encounter (Signed)
Message sent to [facility] requesting update on this ONO order.

## 2015-01-13 NOTE — Telephone Encounter (Signed)
[facility] with [name], states that ONO was picked up today, and results should be sent to us by Friday.   Will forward to [name] to look out for as the results are to be sent to her attn.

## 2015-01-13 NOTE — Telephone Encounter (Signed)
[name] returned call °[phone] °

## 2015-01-13 NOTE — Telephone Encounter (Signed)
Went to sleep around 2am-3am in the morning and woke up 9am on unit.  Patient said he did not sleep very well at all.  Worried about testing with such short of time.  Sent message to [facility] to make sure they picked up unit and get results.  Awaiting message back from [place].

## 2015-01-13 NOTE — Telephone Encounter (Signed)
Lm [facility]

## 2015-01-13 NOTE — Telephone Encounter (Signed)
Per [name], unit for ONO was sent to patient yesterday.  I called and left message on voicemail advising patient to call back and let us know if he has received his unit yet.  Awaiting call back.

## 2015-01-15 ENCOUNTER — Encounter: Payer: Self-pay | Admitting: Family Medicine

## 2015-01-15 ENCOUNTER — Ambulatory Visit (INDEPENDENT_AMBULATORY_CARE_PROVIDER_SITE_OTHER): Payer: 59 | Admitting: Family Medicine

## 2015-01-15 VITALS — BP 104/70 | HR 101 | Temp 99.0°F | Resp 16 | Wt 340.0 lb

## 2015-01-15 DIAGNOSIS — G4700 Insomnia, unspecified: Secondary | ICD-10-CM | POA: Diagnosis not present

## 2015-01-15 DIAGNOSIS — I5032 Chronic diastolic (congestive) heart failure: Secondary | ICD-10-CM | POA: Diagnosis not present

## 2015-01-15 DIAGNOSIS — F411 Generalized anxiety disorder: Secondary | ICD-10-CM | POA: Diagnosis not present

## 2015-01-15 DIAGNOSIS — D751 Secondary polycythemia: Secondary | ICD-10-CM

## 2015-01-15 DIAGNOSIS — N183 Chronic kidney disease, stage 3 unspecified: Secondary | ICD-10-CM

## 2015-01-15 LAB — BASIC METABOLIC PANEL
BUN: 68 mg/dL — ABNORMAL HIGH (ref 6–23)
CO2: 27 mEq/L (ref 19–32)
Calcium: 10.1 mg/dL (ref 8.4–10.5)
Chloride: 94 mEq/L — ABNORMAL LOW (ref 96–112)
Creatinine, Ser: 2.71 mg/dL — ABNORMAL HIGH (ref 0.40–1.50)
GFR: 25.84 mL/min — ABNORMAL LOW (ref >60.00)
Glucose, Bld: 91 mg/dL (ref 70–99)
POTASSIUM: 4.9 meq/L (ref 3.5–5.1)
Sodium: 132 mEq/L — ABNORMAL LOW (ref 135–145)

## 2015-01-15 MED ORDER — DULOXETINE HCL 60 MG PO CPEP: 60.0000 mg | ORAL_CAPSULE | Freq: Every day | ORAL | Status: DC

## 2015-01-15 NOTE — Progress Notes (Signed)
OFFICE VISIT  01/15/2015   CC:  Chief Complaint  Patient presents with  . Follow-up    Pt states that Dr. Vassie LollAlva changed his fluid pill and would like for Dr. Milinda CaveMcGowen to check his kidney fuctions. Pt is fasting.     HPI:    Patient is a 59 y.o. Caucasian male who presents for f/u insomnia, polycythemia/OSA, and recently detected chronic diastolic CHF:  Saw Dr. Vassie LollAlva and got ONO testing last night: results pending (hx of OSA and polycythemia).  Also, echo showed grade 2 diast dysfxn and he was started on lasix 40 mg qd, with option of adding extra 1/2 tab prn.  Has also seen cardiologist (Dr. Mayford Knifeurner) since seeing Dr. Vassie LollAlva and there is plan for 2 day lexiscan myoview to rule out ischemia.  She talked to him about low Na diet/starting recumbant bike (says it is ordered).  He is chipper today.  Says he has more of a positive outlook on life since talking to some old friends.  Has trouble getting to sleep at night, describes inconsistent pattern of problem; no clear cause can be ascertained that could explain the problem consistently.  On amitrip and clonaz long term. Has lots of underlying chronic anxiety + intermittent brief periods of depression that I feel are impacting him.  In fact, I am beginning to think his insomnia is more a symptom of this than a primary problem in and of itself.  Past Medical History  Diagnosis Date  . OSA on CPAP     17 cm H20 (Severe OSA with hypoxia down to 67% on sleep study 09/05/13 [facility], [name])  . Hypertension   . Hyperlipidemia   . Erectile dysfunction   . Insomnia   . Depression 05/2012    [facility]  . Arthritis   . Chronic renal insufficiency, stage III (moderate)     CrCl 40s-50s  . COPD (chronic obstructive pulmonary disease)     [place] in [place] ([name])  . History of adenomatous polyp of colon   . Chronic tension  headaches   . History of alcohol abuse     Remote past-binge drinker  . Polycythemia   . Chronic diastolic heart failure 01/08/2015    Past Surgical History  Procedure Laterality Date  . Finger surgery    . Toe surgery Right     [name]; great toe MCP joint replacment  . Colonoscopy w/ polypectomy  2011 approx    recall 5 yrs  . Rotator cuff repair Right 2011  . Uvulectomy      did not help OSA  . Tonsillectomy/adenoidectomy/turbinate reduction      did not help OSA    Outpatient Prescriptions Prior to Visit  Medication Sig Dispense Refill  . amitriptyline (ELAVIL) 25 MG tablet 3 tabs po qhs 90 tablet 6  . aspirin EC 81 MG tablet Take 1 tablet (81 mg total) by mouth daily. 30 tablet 0  . azelastine (ASTELIN) 0.1 % nasal spray Place into both nostrils 2 (two) times daily. Use in each nostril as directed    . budesonide-formoterol (SYMBICORT) 160-4.5 MCG/ACT inhaler Inhale 2 puffs into the lungs 2 (two) times daily.    . clonazePAM (KLONOPIN) 1 MG tablet Take 2 tablets (2 mg total) by mouth at bedtime. 60 tablet 5  . furosemide (LASIX) 40 MG tablet Take 1 tablet (40 mg total) by mouth  every morning. Take an additional 20 mg (1/2 tablet) daily as needed when you have swelling. 45 tablet 6  . lisinopril (PRINIVIL,ZESTRIL) 40 MG tablet Take 20 mg by mouth daily.    . magnesium oxide (MAG-OX) 400 MG tablet Take 400 mg by mouth daily.    . pravastatin (PRAVACHOL) 40 MG tablet Take 40 mg by mouth daily.     No facility-administered medications prior to visit.    No Known Allergies  ROS As per HPI  PE: Blood pressure 104/70, pulse 101, temperature 99 F (37.2 C), temperature source Oral, resp. rate 16, weight 340 lb (154.223 kg), SpO2 94 %.  7 lb wt loss since last visit with me Gen: Alert, well appearing.  Patient is oriented to person, place, time, and situation. AFFECT: pleasant, lucid thought and speech. More talkative than usual but not hypomanic.  LABS:    Chemistry       Component Value Date/Time   NA 137 12/23/2014 1104   K 3.9 12/23/2014 1104   CL 100 12/23/2014 1104   CO2 28 12/23/2014 1104   BUN 19 12/23/2014 1104   CREATININE 1.63* 12/23/2014 1104   CREATININE 1.36* 07/24/2012 0707      Component Value Date/Time   CALCIUM 9.9 12/23/2014 1104   ALKPHOS 71 10/07/2014 1129   AST 18 10/07/2014 1129   ALT 30 10/07/2014 1129   BILITOT 0.6 10/07/2014 1129     IMPRESSION AND PLAN:  1) OSA; ONO testing recently done to see if desats during the night are causing his polycythemia.  Results pending, [place]  Continue CPAP.  2) Chronic diastolic CHF: low Na diet, exercise and wt loss encouraged, continue lasix 40mg  qd and may use 20mg  additional dose qd prn fluid wt gain.   Recheck BMET today given pt's hx of CRI.  3) Insomnia, likely mixture of primary insomnia  + a symptom of # 4 below. Continue clonaz 2mg  qhs and amitriptyline 25mg -3 tabs qhs. Start duloxetine as noted below.  4) Anxiety and depression: more anxiety than anything else at this time.  His depression is more situational in nature and not currently bothering him.  Will start trial of duloxetine 60mg  qhs and see how he is doing in 2 mo. Therapeutic expectations and side effect profile of medication discussed today.  Patient's questions answered.  An After Visit Summary was printed and given to the patient.  FOLLOW UP: Return in about 6 weeks (around 02/26/2015) for routine chronic illness f/u.

## 2015-01-15 NOTE — Telephone Encounter (Signed)
Spoke to [name] about ONO results.  She said they are having trouble downloading the results and she will get them to me as soon as she can.  Called and advised patient via voicemail that I am awaiting download and will call him as soon as I have received the download.

## 2015-01-15 NOTE — Progress Notes (Signed)
Pre visit review using our clinic review tool, if applicable. No additional management support is needed unless otherwise documented below in the visit note. 

## 2015-01-18 ENCOUNTER — Other Ambulatory Visit: Payer: Self-pay | Admitting: *Deleted

## 2015-01-18 DIAGNOSIS — R7989 Other specified abnormal findings of blood chemistry: Secondary | ICD-10-CM

## 2015-01-18 DIAGNOSIS — N183 Chronic kidney disease, stage 3 unspecified: Secondary | ICD-10-CM

## 2015-01-18 DIAGNOSIS — E871 Hypo-osmolality and hyponatremia: Secondary | ICD-10-CM

## 2015-01-18 NOTE — Telephone Encounter (Signed)
[facility] requesting update on [facility] results.

## 2015-01-19 NOTE — Telephone Encounter (Signed)
LMTC x 1 for [name] with [facility] 

## 2015-01-19 NOTE — Telephone Encounter (Signed)
Received ONO results.  Given to Dr. Vassie LollAlva to review.  Awaiting his response on ONO results.

## 2015-01-20 NOTE — Telephone Encounter (Signed)
No sig desatn

## 2015-01-20 NOTE — Telephone Encounter (Signed)
lmtcb

## 2015-01-21 ENCOUNTER — Other Ambulatory Visit: Payer: Self-pay | Admitting: *Deleted

## 2015-01-21 MED ORDER — BUDESONIDE-FORMOTEROL FUMARATE 160-4.5 MCG/ACT IN AERO: 2.0000 | INHALATION_SPRAY | Freq: Two times a day (BID) | RESPIRATORY_TRACT | Status: DC

## 2015-01-21 NOTE — Telephone Encounter (Signed)
Spoke with pt and relayed results.  Pt wants to know if he didn't have any significant desats then why is his RBC count so high? RA please advise.  Thanks!

## 2015-01-21 NOTE — Telephone Encounter (Signed)
Pt returning call.Peter Briggs ° °

## 2015-01-21 NOTE — Telephone Encounter (Signed)
Pt called requesting refill for Symbicort. LOV: 01/15/15, up coming ov: 04/23/15, last written: unknown. Rx sent for #1 w 6RF.

## 2015-01-26 ENCOUNTER — Other Ambulatory Visit (INDEPENDENT_AMBULATORY_CARE_PROVIDER_SITE_OTHER): Payer: 59

## 2015-01-26 ENCOUNTER — Other Ambulatory Visit: Payer: Self-pay | Admitting: Pulmonary Disease

## 2015-01-26 DIAGNOSIS — N183 Chronic kidney disease, stage 3 unspecified: Secondary | ICD-10-CM

## 2015-01-26 DIAGNOSIS — E871 Hypo-osmolality and hyponatremia: Secondary | ICD-10-CM

## 2015-01-26 DIAGNOSIS — R7989 Other specified abnormal findings of blood chemistry: Secondary | ICD-10-CM

## 2015-01-26 DIAGNOSIS — G4733 Obstructive sleep apnea (adult) (pediatric): Secondary | ICD-10-CM

## 2015-01-26 LAB — BASIC METABOLIC PANEL
BUN: 65 mg/dL — ABNORMAL HIGH (ref 6–23)
CHLORIDE: 98 meq/L (ref 96–112)
CO2: 25 mEq/L (ref 19–32)
Calcium: 9.7 mg/dL (ref 8.4–10.5)
Creatinine, Ser: 2.52 mg/dL — ABNORMAL HIGH (ref 0.40–1.50)
GFR: 28.1 mL/min — AB (ref >60.00)
GLUCOSE: 85 mg/dL (ref 70–99)
POTASSIUM: 5.4 meq/L — AB (ref 3.5–5.1)
SODIUM: 130 meq/L — AB (ref 135–145)

## 2015-01-26 NOTE — Telephone Encounter (Signed)
Left detailed message advising patient to contact Hematologist regarding questions about his RBC count.  Advised patient to call if any further questions. Nothing further needed.

## 2015-01-26 NOTE — Telephone Encounter (Signed)
Not due to OSA or noct hypoxia Defer to hematologist

## 2015-01-26 NOTE — Telephone Encounter (Signed)
RA - please advise. Thanks! 

## 2015-01-27 NOTE — Addendum Note (Signed)
Addended by: Westley HummerKIRBY, Jedrek Dinovo L on: 01/27/2015 10:55 AM   Modules accepted: Orders

## 2015-01-28 ENCOUNTER — Encounter: Payer: Self-pay | Admitting: Pulmonary Disease

## 2015-01-28 ENCOUNTER — Telehealth: Payer: Self-pay | Admitting: *Deleted

## 2015-01-28 ENCOUNTER — Ambulatory Visit (INDEPENDENT_AMBULATORY_CARE_PROVIDER_SITE_OTHER): Payer: 59 | Admitting: Pulmonary Disease

## 2015-01-28 VITALS — BP 121/82 | HR 98 | Temp 99.0°F | Ht 72.0 in | Wt 331.0 lb

## 2015-01-28 DIAGNOSIS — N183 Chronic kidney disease, stage 3 unspecified: Secondary | ICD-10-CM

## 2015-01-28 DIAGNOSIS — J42 Unspecified chronic bronchitis: Secondary | ICD-10-CM

## 2015-01-28 DIAGNOSIS — G4733 Obstructive sleep apnea (adult) (pediatric): Secondary | ICD-10-CM | POA: Diagnosis not present

## 2015-01-28 DIAGNOSIS — J449 Chronic obstructive pulmonary disease, unspecified: Secondary | ICD-10-CM | POA: Insufficient documentation

## 2015-01-28 DIAGNOSIS — Z9989 Dependence on other enabling machines and devices: Principal | ICD-10-CM

## 2015-01-28 NOTE — Telephone Encounter (Signed)
Tell him that this will not likely affect his stress test at all. Has he been monitoring his blood pressures since stopping the lisinopril?  Let me know-thx

## 2015-01-28 NOTE — Telephone Encounter (Signed)
Pt called and stated that he is going to have a stress test done 6/8-6/9. He stated that he was advised to stop taking his lisinopril and is wondering if that will have any affect on the stress test.
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
## 2015-01-28 NOTE — Patient Instructions (Signed)
Obtain PFT results from Dr [name] - [facility] CPAP download report

## 2015-01-28 NOTE — Assessment & Plan Note (Signed)
Ct cpap 17 cm  Weight loss encouraged, compliance with goal of at least 4-6 hrs every night is the expectation. Advised against medications with sedative side effects Cautioned against driving when sleepy - understanding that sleepiness will vary on a day to day basis

## 2015-01-28 NOTE — Telephone Encounter (Signed)
Left message for pt to call back  °

## 2015-01-28 NOTE — Assessment & Plan Note (Signed)
Rpt labs planned next week Lisinopril held - we discussed ?orthostatic dizziness

## 2015-01-29 NOTE — Telephone Encounter (Signed)
Pt advised and voiced understanding. He stated that his blood pressure has been doing well since he stopped taking the Lisinopril.

## 2015-01-29 NOTE — Telephone Encounter (Signed)
Noted  

## 2015-02-01 ENCOUNTER — Telehealth (HOSPITAL_COMMUNITY): Payer: Self-pay

## 2015-02-01 NOTE — Telephone Encounter (Signed)
Left message per DPR on voicemail in reference to upcoming appointment scheduled on 02-03-2015 at 9:30am with detailed instructions given per Myocardial Perfusion Study Information Sheet for the test. Phone number given for call back for any questions. Randa EvensEdwards, Jhonnie Aliano A

## 2015-02-02 ENCOUNTER — Telehealth: Payer: Self-pay | Admitting: Pulmonary Disease

## 2015-02-02 NOTE — Telephone Encounter (Signed)
PFTs 09/2013 no obs, DLCO 79%

## 2015-02-03 ENCOUNTER — Other Ambulatory Visit (INDEPENDENT_AMBULATORY_CARE_PROVIDER_SITE_OTHER): Payer: 59

## 2015-02-03 ENCOUNTER — Ambulatory Visit (HOSPITAL_COMMUNITY): Payer: 59 | Attending: Cardiology

## 2015-02-03 DIAGNOSIS — N183 Chronic kidney disease, stage 3 unspecified: Secondary | ICD-10-CM

## 2015-02-03 DIAGNOSIS — E871 Hypo-osmolality and hyponatremia: Secondary | ICD-10-CM

## 2015-02-03 DIAGNOSIS — R0609 Other forms of dyspnea: Secondary | ICD-10-CM | POA: Diagnosis not present

## 2015-02-03 DIAGNOSIS — R5383 Other fatigue: Secondary | ICD-10-CM | POA: Diagnosis not present

## 2015-02-03 DIAGNOSIS — I10 Essential (primary) hypertension: Secondary | ICD-10-CM | POA: Insufficient documentation

## 2015-02-03 HISTORY — PX: CARDIOVASCULAR STRESS TEST: SHX262

## 2015-02-03 LAB — BASIC METABOLIC PANEL
BUN: 36 mg/dL — AB (ref 6–23)
CALCIUM: 9.8 mg/dL (ref 8.4–10.5)
CO2: 30 meq/L (ref 19–32)
Chloride: 97 mEq/L (ref 96–112)
Creatinine, Ser: 1.86 mg/dL — ABNORMAL HIGH (ref 0.40–1.50)
GFR: 39.89 mL/min — ABNORMAL LOW (ref >60.00)
Glucose, Bld: 97 mg/dL (ref 70–99)
POTASSIUM: 3.8 meq/L (ref 3.5–5.1)
SODIUM: 135 meq/L (ref 135–145)

## 2015-02-03 MED ORDER — TECHNETIUM TC 99M SESTAMIBI GENERIC - CARDIOLITE
33.0000 | Freq: Once | INTRAVENOUS | Status: AC | PRN
  Administered 2015-02-03: 33 via INTRAVENOUS

## 2015-02-03 MED ORDER — REGADENOSON 0.4 MG/5ML IV SOLN
0.4000 mg | Freq: Once | INTRAVENOUS | Status: AC
  Administered 2015-02-03: 0.4 mg via INTRAVENOUS

## 2015-02-04 ENCOUNTER — Encounter: Payer: Self-pay | Admitting: Family Medicine

## 2015-02-04 ENCOUNTER — Ambulatory Visit (HOSPITAL_COMMUNITY): Payer: 59 | Attending: Internal Medicine

## 2015-02-04 ENCOUNTER — Encounter: Payer: Self-pay | Admitting: Pulmonary Disease

## 2015-02-04 LAB — MYOCARDIAL PERFUSION IMAGING
CSEPPHR: 98 {beats}/min
LV sys vol: 34 mL
LVDIAVOL: 81 mL
NUC STRESS EF: 58 %
RATE: 0.39
Rest HR: 80 {beats}/min
SDS: 0
SRS: 7
SSS: 7
TID: 1.02

## 2015-02-04 MED ORDER — TECHNETIUM TC 99M SESTAMIBI GENERIC - CARDIOLITE
33.0000 | Freq: Once | INTRAVENOUS | Status: AC | PRN
  Administered 2015-02-04: 33 via INTRAVENOUS

## 2015-02-05 ENCOUNTER — Other Ambulatory Visit: Payer: 59

## 2015-02-05 ENCOUNTER — Telehealth: Payer: Self-pay | Admitting: Cardiology

## 2015-02-05 MED ORDER — LISINOPRIL 5 MG PO TABS: 5.0000 mg | ORAL_TABLET | Freq: Every day | ORAL | Status: DC

## 2015-02-05 NOTE — Telephone Encounter (Signed)
-----   Message from Quintella Reichert, MD sent at 02/04/2015  5:10 PM EDT ----- Please let patient know that stress test was fine

## 2015-02-05 NOTE — Addendum Note (Signed)
Addended by: Westley Hummer on: 02/05/2015 09:15 AM   Modules accepted: Orders

## 2015-02-05 NOTE — Telephone Encounter (Signed)
Pt rtn call to KATIE

## 2015-02-05 NOTE — Telephone Encounter (Signed)
Informed patient of results and verbal understanding expressed.  

## 2015-02-11 ENCOUNTER — Telehealth: Payer: Self-pay | Admitting: *Deleted

## 2015-02-11 NOTE — Telephone Encounter (Signed)
Left detailed message on cell vm, okay per dpr.

## 2015-02-11 NOTE — Telephone Encounter (Signed)
Pt called stating that his legs are starting to swell and he would like to go back to a whole furosemide tablet. Please advise. Thanks.

## 2015-02-11 NOTE — Telephone Encounter (Signed)
OK to go back on a whole 40 mg lasix tab once a day. He should already have an appt for a f/u BMET scheduled for about a week from now.-thx

## 2015-02-16 ENCOUNTER — Encounter: Payer: Self-pay | Admitting: Pulmonary Disease

## 2015-02-18 ENCOUNTER — Other Ambulatory Visit (INDEPENDENT_AMBULATORY_CARE_PROVIDER_SITE_OTHER): Payer: 59

## 2015-02-18 DIAGNOSIS — E875 Hyperkalemia: Secondary | ICD-10-CM | POA: Diagnosis not present

## 2015-02-18 LAB — BASIC METABOLIC PANEL
BUN: 45 mg/dL — ABNORMAL HIGH (ref 6–23)
CALCIUM: 9.7 mg/dL (ref 8.4–10.5)
CO2: 28 meq/L (ref 19–32)
CREATININE: 2.23 mg/dL — AB (ref 0.40–1.50)
Chloride: 99 mEq/L (ref 96–112)
GFR: 32.35 mL/min — ABNORMAL LOW (ref >60.00)
GLUCOSE: 75 mg/dL (ref 70–99)
Potassium: 4.1 mEq/L (ref 3.5–5.1)
Sodium: 135 mEq/L (ref 135–145)

## 2015-02-19 ENCOUNTER — Telehealth: Payer: Self-pay | Admitting: *Deleted

## 2015-02-19 NOTE — Telephone Encounter (Signed)
Pt calling asking for lab results from yesterday. Please advise. Thanks.

## 2015-02-19 NOTE — Telephone Encounter (Signed)
Pt advised and voiced understanding, has apt with Dr. Milinda Cave on 02/26/15.

## 2015-02-19 NOTE — Telephone Encounter (Signed)
pls call pt: Advise Kidney function has declined again w/lisinopril. Stop med & review w/Dr mcG at next appt.  Pls sched appt in 2 weeks.

## 2015-02-23 ENCOUNTER — Telehealth: Payer: Self-pay | Admitting: Family Medicine

## 2015-02-23 NOTE — Telephone Encounter (Signed)
Patient got a new bp cuff. His bp &  pulse readings are: 6/28 105/56 pulse 102, 6/28 151/64 pulse 91, 02/22/15 8:43PM 151/96 107, 6/27 8:42PM 168/88 112, 6/27 6:33AM 150/74 89, 6/27 6:32AM 131/69 95, 6/27 5:59AM 204/132 83 (took 40MG  Lisinopril after this reading), 6/27 5:58AM 168/77 83, 6/27 5:57 195/143 83, 6/26 10:15PM 124/75 102, 6/26 9:12AM 142/85 87, 6/26 8:48AM 137/89 87, 6/26 8:42AM 151/67 89, 6/25 9:30PM 120/83 95, 6/25 6:35PM 185/93 106, 6/25 7:45AM 124/75 87, 6/24 8:22AM 131/74 83, 6/22 7:24PM 113/73 114, 6/21 5:36PM 112/72 106, 6/20 1:07PM 108/60 90, 6/18 12:34PM 104/61 86, 6/16 1:16PM 112/69 88, 6/14 12:44PM 111/61 87, 6/14 12:38PM 98/54 100, 6/14 12:35 112/67 90, 6/14 10:16AM 116/75 78, 6/13 5:18PM 100/56 86, 6/13 5:43PM 107/63 86, 6/13 5:41PM 108/57 86. Please advise patient what he should do about his bp meds.

## 2015-02-26 ENCOUNTER — Encounter: Payer: Self-pay | Admitting: Family Medicine

## 2015-02-26 ENCOUNTER — Ambulatory Visit (INDEPENDENT_AMBULATORY_CARE_PROVIDER_SITE_OTHER): Payer: 59 | Admitting: Family Medicine

## 2015-02-26 VITALS — BP 116/77 | HR 98 | Temp 98.2°F | Resp 16 | Ht 72.0 in | Wt 325.0 lb

## 2015-02-26 DIAGNOSIS — I5032 Chronic diastolic (congestive) heart failure: Secondary | ICD-10-CM | POA: Diagnosis not present

## 2015-02-26 DIAGNOSIS — F322 Major depressive disorder, single episode, severe without psychotic features: Secondary | ICD-10-CM

## 2015-02-26 DIAGNOSIS — N183 Chronic kidney disease, stage 3 unspecified: Secondary | ICD-10-CM

## 2015-02-26 DIAGNOSIS — I10 Essential (primary) hypertension: Secondary | ICD-10-CM | POA: Diagnosis not present

## 2015-02-26 NOTE — Progress Notes (Signed)
OFFICE NOTE  03/06/2015  CC:  Chief Complaint  Patient presents with  . Follow-up    6 week f/u. Pt is not fasting.   HPI: Patient is a 59 y.o. Caucasian male who is here for 6 wk f/u , HTN, chronic diastolic CHF, CRI stage III, and anxiety and depression with insomnia. He has still been on HCTZ 25mg  qd and lisinopril 40mg  from his previous MD. Needs to be taking lisin 5mg  and no HCTZ--as rx'd by myself at last visit. Home bp's lately normal, although I think I would rather have him off hctz and if we need more bp control we'll increase lisin in small increments and follow BUN/Cr closely. Saw Dr. Mayford Knifeurner and got stress test since I last saw him: this was normal.  Echo the month prior showed grade 2 diastolic dysfunction.  Pt doing well from depression standpoint, much improved on cymbalta, says "I've got hope for the first time in a long time". No side effect from the med.  Sleep is improving.  Anxiety is improving.   Pertinent PMH:  Past medical, surgical, social, and family history reviewed and changes are noted since last office visit.  MEDS:  Outpatient Prescriptions Prior to Visit  Medication Sig Dispense Refill  . amitriptyline (ELAVIL) 25 MG tablet 3 tabs po qhs 90 tablet 6  . aspirin EC 81 MG tablet Take 1 tablet (81 mg total) by mouth daily. 30 tablet 0  . azelastine (ASTELIN) 0.1 % nasal spray Place into both nostrils 2 (two) times daily. Use in each nostril as directed    . budesonide-formoterol (SYMBICORT) 160-4.5 MCG/ACT inhaler Inhale 2 puffs into the lungs 2 (two) times daily. 1 Inhaler 6  . clonazePAM (KLONOPIN) 1 MG tablet Take 2 tablets (2 mg total) by mouth at bedtime. 60 tablet 5  . DULoxetine (CYMBALTA) 60 MG capsule Take 1 capsule (60 mg total) by mouth daily. 30 capsule 1  . furosemide (LASIX) 40 MG tablet Take 1 tablet (40 mg total) by mouth every morning. Take an additional 20 mg (1/2 tablet) daily as needed when you have swelling. 45 tablet 6  . lisinopril  (PRINIVIL,ZESTRIL) 5 MG tablet Take 1 tablet (5 mg total) by mouth daily. 30 tablet 6  . magnesium oxide (MAG-OX) 400 MG tablet Take 400 mg by mouth daily.    . pravastatin (PRAVACHOL) 40 MG tablet Take 40 mg by mouth daily.     No facility-administered medications prior to visit.    PE: Blood pressure 116/77, pulse 98, temperature 98.2 F (36.8 C), temperature source Oral, resp. rate 16, height 6' (1.829 m), weight 325 lb (147.419 kg), SpO2 94 %. Gen: Alert, well appearing.  Patient is oriented to person, place, time, and situation. CV: RRR, no m/r/g.   LUNGS: CTA bilat, nonlabored resps, good aeration in all lung fields. EXT 1+ bilat LE edema to mid tibia level.  LAB:   Chemistry      Component Value Date/Time   NA 135 02/18/2015 1056   K 4.1 02/18/2015 1056   CL 99 02/18/2015 1056   CO2 28 02/18/2015 1056   BUN 45* 02/18/2015 1056   CREATININE 2.23* 02/18/2015 1056   CREATININE 1.36* 07/24/2012 0707      Component Value Date/Time   CALCIUM 9.7 02/18/2015 1056   ALKPHOS 71 10/07/2014 1129   AST 18 10/07/2014 1129   ALT 30 10/07/2014 1129   BILITOT 0.6 10/07/2014 1129     Lab Results  Component Value Date  CHOL 172 10/07/2014   HDL 36.50* 10/07/2014   LDLCALC 112* 10/07/2014   TRIG 120.0 10/07/2014   CHOLHDL 5 10/07/2014    IMPRESSION AND PLAN:  1) HTN; stable, but he needs to d/c HCTZ and take the  lisinopril I had started at his last visit.  He said he would make this change today. Continue at least qod home bp monitoring.    2) CRI stage III, stable.  CrCl in 30s.  Will watch for cardiorenal syndrome in this context since we'll be using diuretics to keep his diastolic HF from being problematic.  No labs today.  3) Depression, anxiety: much improved.  Continue current med/dose.  Clonazepam and amitriptyline seem to work well for his insomnia.  4) Chronic diastolic CHF: fluid balance seems stable.  Continue low Na diet, ASA, ACE-I, statin, and furosemide.   Keep approp f/u with Dr. Mayford Knife.  An After Visit Summary was printed and given to the patient.  FOLLOW UP: 2 mo, f/u HTN, CRI, check BMET, f/u dep/anx

## 2015-02-26 NOTE — Progress Notes (Signed)
Pre visit review using our clinic review tool, if applicable. No additional management support is needed unless otherwise documented below in the visit note. 

## 2015-03-06 ENCOUNTER — Encounter: Payer: Self-pay | Admitting: Family Medicine

## 2015-03-11 MED ORDER — SILDENAFIL CITRATE 100 MG PO TABS: ORAL_TABLET | ORAL | Status: DC

## 2015-03-11 NOTE — Telephone Encounter (Signed)
Pt advised and voiced understanding.   

## 2015-03-11 NOTE — Telephone Encounter (Signed)
Left message for pt to call back, [facility] [place].

## 2015-03-11 NOTE — Telephone Encounter (Signed)
Patient is experiencing ED. He would like to know if Dr. Milinda CaveMcGowen thinks it would be okay for him to take Viagra or Cialis. Please call him back.

## 2015-03-11 NOTE — Telephone Encounter (Signed)
Pt LMOM returning my call. Tried calling pt back NA, left message to call back.

## 2015-03-11 NOTE — Telephone Encounter (Signed)
Yes, ok to eRx generic viagra  tabs, take 1/2-1 tab po 30 min prior to intercourse, #9, RF x 6.-thx

## 2015-03-22 ENCOUNTER — Other Ambulatory Visit: Payer: Self-pay | Admitting: Family Medicine

## 2015-03-22 NOTE — Telephone Encounter (Signed)
RF request for duloxetine.  LOV: 02/26/15 Next ov: 04/23/15 Last written: 01/15/15 #30 w/ 1RF

## 2015-03-23 ENCOUNTER — Encounter: Payer: Self-pay | Admitting: Family Medicine

## 2015-03-23 ENCOUNTER — Ambulatory Visit (INDEPENDENT_AMBULATORY_CARE_PROVIDER_SITE_OTHER): Payer: 59 | Admitting: Family Medicine

## 2015-03-23 VITALS — BP 146/88 | HR 118 | Temp 98.2°F | Resp 16 | Ht 72.0 in | Wt 305.0 lb

## 2015-03-23 DIAGNOSIS — S8391XD Sprain of unspecified site of right knee, subsequent encounter: Secondary | ICD-10-CM

## 2015-03-23 DIAGNOSIS — M79661 Pain in right lower leg: Secondary | ICD-10-CM

## 2015-03-23 DIAGNOSIS — S9031XD Contusion of right foot, subsequent encounter: Secondary | ICD-10-CM

## 2015-03-23 DIAGNOSIS — M7989 Other specified soft tissue disorders: Secondary | ICD-10-CM | POA: Diagnosis not present

## 2015-03-23 DIAGNOSIS — S93401D Sprain of unspecified ligament of right ankle, subsequent encounter: Secondary | ICD-10-CM | POA: Diagnosis not present
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
## 2015-03-23 NOTE — Progress Notes (Signed)
Pre visit review using our clinic review tool, if applicable. No additional management support is needed unless otherwise documented below in the visit note. 

## 2015-03-24 ENCOUNTER — Telehealth: Payer: Self-pay | Admitting: Family Medicine

## 2015-03-24 ENCOUNTER — Other Ambulatory Visit: Payer: Self-pay | Admitting: *Deleted

## 2015-03-24 MED ORDER — MAGNESIUM OXIDE 400 MG PO TABS: 400.0000 mg | ORAL_TABLET | Freq: Every day | ORAL | Status: DC

## 2015-03-24 MED ORDER — PRAVASTATIN SODIUM 40 MG PO TABS: 40.0000 mg | ORAL_TABLET | Freq: Every day | ORAL | Status: DC

## 2015-03-24 NOTE — Telephone Encounter (Signed)
LOV: 03/23/15 NOV: 04/29/15  RF request for magnesium Last written: Unknown  RF request for prevastatin Last written: Unknown

## 2015-03-24 NOTE — Telephone Encounter (Signed)
Patient is requesting Rx for Magnesium Oxide & Prevastatin to be sent to [facility] [place].

## 2015-03-25 ENCOUNTER — Ambulatory Visit (HOSPITAL_BASED_OUTPATIENT_CLINIC_OR_DEPARTMENT_OTHER)
Admission: RE | Admit: 2015-03-25 | Discharge: 2015-03-25 | Disposition: A | Discharge status: Home / Self Care | Payer: 59 | Source: Ambulatory Visit | Attending: Family Medicine | Admitting: Family Medicine

## 2015-03-25 ENCOUNTER — Telehealth: Payer: Self-pay | Admitting: Family Medicine

## 2015-03-25 DIAGNOSIS — M7989 Other specified soft tissue disorders: Secondary | ICD-10-CM | POA: Diagnosis present

## 2015-03-25 DIAGNOSIS — M79661 Pain in right lower leg: Secondary | ICD-10-CM | POA: Insufficient documentation

## 2015-03-25 DIAGNOSIS — L989 Disorder of the skin and subcutaneous tissue, unspecified: Secondary | ICD-10-CM

## 2015-03-25 NOTE — Telephone Encounter (Signed)
Patient requesting derm referral for growth on L arm close to his wrist. Patient also needs total body check. No particular provider requested.

## 2015-03-25 NOTE — Telephone Encounter (Signed)
Please advise. Thanks.  

## 2015-03-26 NOTE — Telephone Encounter (Signed)
Derm referral ordered per pt's request.

## 2015-03-29 ENCOUNTER — Ambulatory Visit (INDEPENDENT_AMBULATORY_CARE_PROVIDER_SITE_OTHER): Payer: 59 | Admitting: Family Medicine

## 2015-03-29 ENCOUNTER — Encounter: Payer: Self-pay | Admitting: Family Medicine

## 2015-03-29 VITALS — BP 113/77 | HR 104 | Temp 97.7°F | Resp 16 | Ht 72.0 in | Wt 320.0 lb

## 2015-03-29 DIAGNOSIS — S8011XD Contusion of right lower leg, subsequent encounter: Secondary | ICD-10-CM | POA: Diagnosis not present

## 2015-03-29 DIAGNOSIS — M79604 Pain in right leg: Secondary | ICD-10-CM

## 2015-03-29 DIAGNOSIS — S8391XD Sprain of unspecified site of right knee, subsequent encounter: Secondary | ICD-10-CM

## 2015-03-29 DIAGNOSIS — M79661 Pain in right lower leg: Secondary | ICD-10-CM

## 2015-03-29 DIAGNOSIS — M7989 Other specified soft tissue disorders: Secondary | ICD-10-CM

## 2015-03-29 DIAGNOSIS — I872 Venous insufficiency (chronic) (peripheral): Secondary | ICD-10-CM

## 2015-03-29 DIAGNOSIS — L03115 Cellulitis of right lower limb: Secondary | ICD-10-CM

## 2015-03-29 MED ORDER — HYDROCODONE-ACETAMINOPHEN 5-325 MG PO TABS: 1.0000 | ORAL_TABLET | Freq: Four times a day (QID) | ORAL | Status: DC | PRN

## 2015-03-29 MED ORDER — CEPHALEXIN 500 MG PO CAPS
ORAL_CAPSULE | ORAL | Status: DC
Start: 2015-03-29 — End: 2015-04-23

## 2015-03-29 MED ORDER — SILVER SULFADIAZINE 1 % EX CREA
1.0000 | TOPICAL_CREAM | Freq: Two times a day (BID) | CUTANEOUS | Status: DC
Start: 2015-03-29 — End: 2015-04-23

## 2015-03-29 NOTE — Telephone Encounter (Signed)
Left detailed message on cell vm, okay per DPR.  

## 2015-03-29 NOTE — Progress Notes (Signed)
Pre visit review using our clinic review tool, if applicable. No additional management support is needed unless otherwise documented below in the visit note. 

## 2015-03-29 NOTE — Patient Instructions (Signed)
Elevate your legs above the level of your heart for 20 min at least once a day.  Take an extra  lasix pill today and an extra  lasix pill tomorrow.
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
## 2015-03-30 ENCOUNTER — Telehealth: Payer: Self-pay | Admitting: Family Medicine

## 2015-03-30 NOTE — Telephone Encounter (Signed)
Per Jewel Baize she found Rx and pt has been advised.

## 2015-03-30 NOTE — Telephone Encounter (Signed)
Pt said he went to Pharmacy to pick up rx's and the one for Hydrocodone was missing. The Pharmacy told him he needed a hard copy. Was that RX given to pt?  Please call and let him know if a rx will be called in. His number is [phone]

## 2015-04-02 ENCOUNTER — Ambulatory Visit (INDEPENDENT_AMBULATORY_CARE_PROVIDER_SITE_OTHER): Payer: 59 | Admitting: Family Medicine

## 2015-04-02 ENCOUNTER — Telehealth: Payer: Self-pay | Admitting: Family Medicine

## 2015-04-02 ENCOUNTER — Ambulatory Visit: Payer: Self-pay | Admitting: Family Medicine

## 2015-04-02 ENCOUNTER — Encounter: Payer: Self-pay | Admitting: Family Medicine

## 2015-04-02 VITALS — BP 137/80 | HR 116 | Temp 98.0°F | Resp 16 | Ht 72.0 in | Wt 324.0 lb

## 2015-04-02 DIAGNOSIS — I5032 Chronic diastolic (congestive) heart failure: Secondary | ICD-10-CM | POA: Diagnosis not present

## 2015-04-02 DIAGNOSIS — S8011XD Contusion of right lower leg, subsequent encounter: Secondary | ICD-10-CM

## 2015-04-02 DIAGNOSIS — R600 Localized edema: Secondary | ICD-10-CM | POA: Diagnosis not present

## 2015-04-02 DIAGNOSIS — S8011XA Contusion of right lower leg, initial encounter: Secondary | ICD-10-CM | POA: Insufficient documentation

## 2015-04-02 LAB — BASIC METABOLIC PANEL
BUN: 33 mg/dL — ABNORMAL HIGH (ref 6–23)
CALCIUM: 9.3 mg/dL (ref 8.4–10.5)
CO2: 32 meq/L (ref 19–32)
CREATININE: 1.75 mg/dL — AB (ref 0.40–1.50)
Chloride: 97 mEq/L (ref 96–112)
GFR: 42.78 mL/min — ABNORMAL LOW (ref >60.00)
GLUCOSE: 61 mg/dL — AB (ref 70–99)
Potassium: 4 mEq/L (ref 3.5–5.1)
SODIUM: 138 meq/L (ref 135–145)

## 2015-04-02 MED ORDER — FUROSEMIDE 40 MG PO TABS: ORAL_TABLET | ORAL | Status: DC

## 2015-04-02 MED ORDER — HYDROCODONE-ACETAMINOPHEN 5-325 MG PO TABS: 1.0000 | ORAL_TABLET | Freq: Four times a day (QID) | ORAL | Status: DC | PRN

## 2015-04-02 NOTE — Telephone Encounter (Signed)
Patient called in and advised that when pain medication effects wore off today he felt his pain under & to the left of his knee cap. You were pressing there earlier during the OV. Patient not requesting a CB.

## 2015-04-02 NOTE — Progress Notes (Signed)
OFFICE NOTE  04/02/2015  CC:  Chief Complaint  Patient presents with  . Follow-up    10 day f/u.      HPI: Patient is a 59 y.o. Caucasian male who is here for 4d f/u R knee sprain and R LL contusion. Also treating for possible R LL cellulitis. Has been on keflex and prn vicodin last 4d, also took  bid of his lasix for 2 days since I last saw him.    Pertinent PMH:  Past medical, surgical, social, and family history reviewed and no changes are noted since last office visit.  MEDS:  Outpatient Prescriptions Prior to Visit  Medication Sig Dispense Refill  . amitriptyline (ELAVIL) 25 MG tablet 3 tabs po qhs 90 tablet 6  . aspirin EC 81 MG tablet Take 1 tablet (81 mg total) by mouth daily. 30 tablet 0  . azelastine (ASTELIN) 0.1 % nasal spray Place into both nostrils 2 (two) times daily. Use in each nostril as directed    . budesonide-formoterol (SYMBICORT) 160-4.5 MCG/ACT inhaler Inhale 2 puffs into the lungs 2 (two) times daily. 1 Inhaler 6  . cephALEXin (KEFLEX) 500 MG capsule 1 cap po tid x 7d 21 capsule 0  . clonazePAM (KLONOPIN) 1 MG tablet Take 2 tablets (2 mg total) by mouth at bedtime. 60 tablet 5  . DULoxetine (CYMBALTA) 60 MG capsule TAKE ONE CAPSULE BY MOUTH ONCE DAILY 30 capsule 3  . furosemide (LASIX) 40 MG tablet Take 1 tablet (40 mg total) by mouth every morning. Take an additional 20 mg (1/2 tablet) daily as needed when you have swelling. 45 tablet 6  . HYDROcodone-acetaminophen (NORCO/VICODIN) 5-325 MG per tablet Take 1-2 tablets by mouth every 6 (six) hours as needed for moderate pain. 20 tablet 0  . lisinopril (PRINIVIL,ZESTRIL) 5 MG tablet Take 1 tablet (5 mg total) by mouth daily. 30 tablet 6  . magnesium oxide (MAG-OX) 400 MG tablet Take 1 tablet (400 mg total) by mouth daily. 30 tablet 6  . pravastatin (PRAVACHOL) 40 MG tablet Take 1 tablet (40 mg total) by mouth daily. 30 tablet 6  . sildenafil (VIAGRA) 100 MG tablet 1/2 (half) to 1 (one) tablet by mouth 30  min prior to intercourse 9 tablet 6  . silver sulfADIAZINE (SILVADENE) 1 % cream Apply 1 application topically 2 (two) times daily. 20 g 1   No facility-administered medications prior to visit.    PE: Blood pressure 137/80, pulse 116, temperature 98 F (36.7 C), temperature source Oral, resp. rate 16, height 6' (1.829 m), weight 324 lb (146.965 kg), SpO2 95 %. Gen: Alert, well appearing.  Patient is oriented to person, place, time, and situation. AFFECT: pleasant, lucid thought and speech. Legs: R>L pitting edema, up to 4+ in some regions, without any skin breakdown or weeping or tenderness.  No erythema or warmth of either leg or foot.  No knee tenderness.  NO knee pain with varus/valgus stress.  IMPRESSION AND PLAN:  R knee sprain/contusion, with R LL contusion as well. All superimposed on chronic peripheral edema secondary to venous insufficiency (+/-? Chronic diastolic CHF).    He is improving, will hopefully be able to stop taking vicodin pretty soon.  BMET today. If lytes/cr good then will increase lasix to  bid as his maintenance dose. Will give #20 more vicodin.  FOLLOW UP: keep appt already set for 04/23/15

## 2015-04-02 NOTE — Telephone Encounter (Signed)
FYI

## 2015-04-02 NOTE — Progress Notes (Signed)
Pre visit review using our clinic review tool, if applicable. No additional management support is needed unless otherwise documented below in the visit note. 

## 2015-04-04 NOTE — Telephone Encounter (Signed)
NOTED

## 2015-04-07 ENCOUNTER — Telehealth: Payer: Self-pay | Admitting: *Deleted

## 2015-04-07 DIAGNOSIS — M25561 Pain in right knee: Secondary | ICD-10-CM

## 2015-04-07 MED ORDER — HYDROCODONE-ACETAMINOPHEN 5-325 MG PO TABS: 1.0000 | ORAL_TABLET | Freq: Four times a day (QID) | ORAL | Status: DC | PRN
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
## 2015-04-08 ENCOUNTER — Encounter: Payer: Self-pay | Admitting: Cardiology

## 2015-04-19 ENCOUNTER — Ambulatory Visit: Payer: 59 | Admitting: Cardiology

## 2015-04-23 ENCOUNTER — Ambulatory Visit (INDEPENDENT_AMBULATORY_CARE_PROVIDER_SITE_OTHER): Payer: 59 | Admitting: Family Medicine

## 2015-04-23 ENCOUNTER — Encounter: Payer: Self-pay | Admitting: Family Medicine

## 2015-04-23 ENCOUNTER — Encounter: Payer: Self-pay | Admitting: *Deleted

## 2015-04-23 VITALS — BP 134/89 | HR 106 | Temp 97.9°F | Resp 16 | Ht 72.0 in | Wt 310.0 lb

## 2015-04-23 DIAGNOSIS — G4700 Insomnia, unspecified: Secondary | ICD-10-CM | POA: Diagnosis not present

## 2015-04-23 DIAGNOSIS — F3111 Bipolar disorder, current episode manic without psychotic features, mild: Secondary | ICD-10-CM

## 2015-04-23 DIAGNOSIS — F322 Major depressive disorder, single episode, severe without psychotic features: Secondary | ICD-10-CM | POA: Diagnosis not present

## 2015-04-23 DIAGNOSIS — N183 Chronic kidney disease, stage 3 unspecified: Secondary | ICD-10-CM

## 2015-04-23 DIAGNOSIS — I10 Essential (primary) hypertension: Secondary | ICD-10-CM

## 2015-04-23 LAB — BASIC METABOLIC PANEL
BUN: 34 mg/dL — ABNORMAL HIGH (ref 6–23)
CHLORIDE: 98 meq/L (ref 96–112)
CO2: 27 meq/L (ref 19–32)
Calcium: 10 mg/dL (ref 8.4–10.5)
Creatinine, Ser: 1.52 mg/dL — ABNORMAL HIGH (ref 0.40–1.50)
GFR: 50.32 mL/min — ABNORMAL LOW (ref >60.00)
Glucose, Bld: 91 mg/dL (ref 70–99)
POTASSIUM: 4.4 meq/L (ref 3.5–5.1)
Sodium: 134 mEq/L — ABNORMAL LOW (ref 135–145)

## 2015-04-23 MED ORDER — LAMOTRIGINE 25 MG PO TABS: ORAL_TABLET | ORAL | Status: DC

## 2015-04-23 NOTE — Progress Notes (Signed)
Pre visit review using our clinic review tool, if applicable. No additional management support is needed unless otherwise documented below in the visit note. 
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
## 2015-04-29 ENCOUNTER — Encounter: Payer: Self-pay | Admitting: Family Medicine

## 2015-04-29 ENCOUNTER — Ambulatory Visit: Payer: Self-pay | Admitting: Family Medicine

## 2015-04-29 DIAGNOSIS — Z125 Encounter for screening for malignant neoplasm of prostate: Secondary | ICD-10-CM | POA: Insufficient documentation

## 2015-04-30 ENCOUNTER — Encounter: Payer: Self-pay | Admitting: Cardiology

## 2015-05-13 ENCOUNTER — Ambulatory Visit: Payer: 59 | Admitting: Family Medicine

## 2015-06-03 ENCOUNTER — Ambulatory Visit: Payer: 59 | Admitting: Cardiology

## 2015-06-16 NOTE — Progress Notes (Signed)
This encounter was created in error - please disregard.

## 2015-06-17 ENCOUNTER — Encounter: Payer: 59 | Admitting: Cardiology

## 2015-06-25 ENCOUNTER — Encounter: Payer: Self-pay | Admitting: Cardiology

## 2015-08-01 ENCOUNTER — Ambulatory Visit (INDEPENDENT_AMBULATORY_CARE_PROVIDER_SITE_OTHER): Payer: Medicare Other | Admitting: Family Medicine

## 2015-08-01 ENCOUNTER — Ambulatory Visit (INDEPENDENT_AMBULATORY_CARE_PROVIDER_SITE_OTHER): Payer: Medicare Other

## 2015-08-01 VITALS — BP 124/72 | HR 101 | Temp 97.5°F | Resp 18 | Ht 71.0 in | Wt 269.8 lb

## 2015-08-01 DIAGNOSIS — M79671 Pain in right foot: Secondary | ICD-10-CM | POA: Diagnosis not present

## 2015-08-01 DIAGNOSIS — M25474 Effusion, right foot: Secondary | ICD-10-CM

## 2015-08-01 DIAGNOSIS — M25571 Pain in right ankle and joints of right foot: Secondary | ICD-10-CM
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
## 2015-08-01 NOTE — Patient Instructions (Signed)
This is a small fracture of the navicular bone of the foot. I believe it will heal in about 2 weeks if you keep it in the cam walker most of the time, removing only for sleep and taking a shower.

## 2015-09-12 ENCOUNTER — Ambulatory Visit (INDEPENDENT_AMBULATORY_CARE_PROVIDER_SITE_OTHER): Payer: Medicare Other | Admitting: Physician Assistant

## 2015-09-12 VITALS — BP 110/76 | HR 107 | Temp 97.8°F | Resp 20 | Ht 70.5 in | Wt 254.4 lb

## 2015-09-12 DIAGNOSIS — M79609 Pain in unspecified limb: Secondary | ICD-10-CM

## 2015-09-12 DIAGNOSIS — M25475 Effusion, left foot: Secondary | ICD-10-CM

## 2015-09-12 LAB — POCT CBC
GRANULOCYTE PERCENT: 79.9 % (ref 37–80)
HCT, POC: 52.9 % (ref 43.5–53.7)
HEMOGLOBIN: 18.7 g/dL — AB (ref 14.1–18.1)
Lymph, poc: 2.1 (ref 0.6–3.4)
MCH: 31 pg (ref 27–31.2)
MCHC: 35.3 g/dL (ref 31.8–35.4)
MCV: 87.9 fL (ref 80–97)
MID (cbc): 1 — AB (ref 0–0.9)
MPV: 7 fL (ref 0–99.8)
PLATELET COUNT, POC: 308 10*3/uL (ref 142–424)
POC Granulocyte: 12.2 — AB (ref 2–6.9)
POC LYMPH PERCENT: 13.7 %L (ref 10–50)
POC MID %: 6.4 %M (ref 0–12)
RBC: 6.02 M/uL (ref 4.69–6.13)
RDW, POC: 14.6 %
WBC: 15.3 10*3/uL — AB (ref 4.6–10.2)

## 2015-09-12 LAB — URIC ACID: URIC ACID, SERUM: 9 mg/dL — AB (ref 4.0–7.8)

## 2015-09-12 MED ORDER — PREDNISONE 20 MG PO TABS
ORAL_TABLET | ORAL | Status: AC
Start: 2015-09-12 — End: 2015-09-21

## 2015-09-12 NOTE — Progress Notes (Addendum)
09/12/2015 2:55 PM   DOB: February 06, 1956 / MRN: [id number]  SUBJECTIVE:  Peter Briggs is a 60 y.o. male presenting for left foot swelling, pain and redness with exquisite tenderness. This started 3 days ago.  Reports that even having the sheet on his foot at night bothers him.  He has never had this problem before.  Denies posterior calf tenderness, SOB, new DOE. No history of diabetes. No shellfish in the last year. Denies EtOH.      He has No Known Allergies.   He  has a past medical history of OSA on CPAP; Hypertension; Hyperlipidemia; Erectile dysfunction; Insomnia; Depression (05/2012); Arthritis; Chronic renal insufficiency, stage II (mild); COPD (chronic obstructive pulmonary disease) (HCC); History of adenomatous polyp of colon; Chronic tension headaches; History of alcohol abuse; Polycythemia; Chronic diastolic heart failure (HCC) (12/04/8117); History of restless legs syndrome; and Anxiety.    He  reports that he has quit smoking. His smoking use included Cigars. He has never used smokeless tobacco. He reports that he does not drink alcohol or use illicit drugs. He  reports that he does not currently engage in sexual activity. The patient  has past surgical history that includes Finger surgery; Toe Surgery (Right); Colonoscopy w/ polypectomy (2011 approx); Rotator cuff repair (Right, 2011); Uvulectomy; Tonsillectomy/adenoidectomy/turbinate reduction; Cardiovascular stress test (02/03/15); transthoracic echocardiogram (12/2014); Joint replacement; and Fracture surgery.  His family history includes Asthma in his daughter and daughter; COPD in his mother; Kidney disease in his daughter; Sleep apnea in his son and son.  Review of Systems  Constitutional: Negative for fever and chills.  Eyes: Negative for blurred vision.  Respiratory: Negative for cough and shortness of breath.   Cardiovascular: Negative for chest pain.  Gastrointestinal: Negative for nausea and abdominal pain.    Genitourinary: Negative for dysuria, urgency and frequency.  Musculoskeletal: Positive for myalgias and joint pain.  Skin: Negative for rash.  Neurological: Negative for dizziness, tingling and headaches.  Psychiatric/Behavioral: Negative for depression. The patient is not nervous/anxious.     Problem list and medications reviewed and updated by myself where necessary, and exist elsewhere in the encounter.   OBJECTIVE:  BP 110/76 mmHg  Pulse 107  Temp(Src) 97.8 F (36.6 C) (Oral)  Resp 20  Ht 5' 10.5" (1.791 m)  Wt 254 lb 6.4 oz (115.395 kg)  BMI 35.97 kg/m2  SpO2 97%  Physical Exam  Constitutional: He is oriented to person, place, and time. He appears well-developed. He does not appear ill.  Eyes: Conjunctivae and EOM are normal. Pupils are equal, round, and reactive to light.  Cardiovascular: Regular rhythm and intact distal pulses.  Exam reveals no gallop and no friction rub.   No murmur heard. Pulmonary/Chest: Effort normal.  Abdominal: He exhibits no distension.  Musculoskeletal: Normal range of motion.  Left dorsum exquisitely tender, red, warm.    Neurological: He is alert and oriented to person, place, and time. No cranial nerve deficit. Coordination normal.  Skin: Skin is warm and dry. He is not diaphoretic.  Psychiatric: He has a normal mood and affect.  Nursing note and vitals reviewed.      Results for orders placed or performed in visit on 09/12/15 (from the past 48 hour(s))  Uric Acid     Status: Abnormal   Collection Time: 09/12/15 10:34 AM  Result Value Ref Range   Uric Acid, Serum 9.0 (H) 4.0 - 7.8 mg/dL  POCT CBC     Status: Abnormal   Collection Time: 09/12/15 10:43 AM  Result Value Ref Range   WBC 15.3 (A) 4.6 - 10.2 K/uL   Lymph, poc 2.1 0.6 - 3.4   POC LYMPH PERCENT 13.7 10 - 50 %L   MID (cbc) 1.0 (A) 0 - 0.9   POC MID % 6.4 0 - 12 %M   POC Granulocyte 12.2 (A) 2 - 6.9   Granulocyte percent 79.9 37 - 80 %G   RBC 6.02 4.69 - 6.13 M/uL    Hemoglobin 18.7 (A) 14.1 - 18.1 g/dL   HCT, POC 16.152.9 09.643.5 - 53.7 %   MCV 87.9 80 - 97 fL   MCH, POC 31.0 27 - 31.2 pg   MCHC 35.3 31.8 - 35.4 g/dL   RDW, POC 04.514.6 %   Platelet Count, POC 308 142 - 424 K/uL   MPV 7.0 0 - 99.8 fL    ASSESSMENT AND PLAN  Peter Briggs was seen today for foot pain and follow-up.  Diagnoses and all orders for this visit:  Tenderness in limb: History and physical consistent with gout flare.  Will attempt to quantify as he has never had this problem before.  Will treat empirically.   -     POCT CBC -     Uric Acid -     predniSONE (DELTASONE) 20 MG tablet; Take 3 in the morning for 3 days, then 2 in the morning for 3 days, and then 1 in the morning for 3 days.  Swelling of foot joint, left: Managed with the above.      The patient was advised to call or return to clinic if he does not see an improvement in symptoms or to seek the care of the closest emergency department if he worsens with the above plan.   Deliah BostonMichael Derrian Poli, MHS, PA-C [facility] 09/12/2015 2:55 PM

## 2015-09-13 NOTE — Progress Notes (Signed)
  Medical screening examination/treatment/procedure(s) were performed by non-physician practitioner and as supervising physician I was immediately available for consultation/collaboration.     

## 2015-12-09 ENCOUNTER — Ambulatory Visit (INDEPENDENT_AMBULATORY_CARE_PROVIDER_SITE_OTHER): Payer: Medicare Other | Admitting: Family Medicine

## 2015-12-09 VITALS — BP 108/80 | HR 95 | Temp 97.6°F | Resp 17 | Ht 70.0 in | Wt 252.0 lb

## 2015-12-09 DIAGNOSIS — N529 Male erectile dysfunction, unspecified: Secondary | ICD-10-CM

## 2015-12-09 DIAGNOSIS — S9306XA Dislocation of unspecified ankle joint, initial encounter: Secondary | ICD-10-CM | POA: Diagnosis not present

## 2015-12-09 DIAGNOSIS — R600 Localized edema: Secondary | ICD-10-CM | POA: Diagnosis not present

## 2015-12-09 DIAGNOSIS — E785 Hyperlipidemia, unspecified: Secondary | ICD-10-CM | POA: Diagnosis not present

## 2015-12-09 DIAGNOSIS — M25579 Pain in unspecified ankle and joints of unspecified foot: Secondary | ICD-10-CM | POA: Diagnosis not present

## 2015-12-09 DIAGNOSIS — S9303XA Subluxation of unspecified ankle joint, initial encounter: Secondary | ICD-10-CM | POA: Insufficient documentation

## 2015-12-09 DIAGNOSIS — S9301XA Subluxation of right ankle joint, initial encounter: Secondary | ICD-10-CM

## 2015-12-09 MED ORDER — AMITRIPTYLINE HCL 25 MG PO TABS: ORAL_TABLET | ORAL | Status: DC

## 2015-12-09 MED ORDER — SILDENAFIL CITRATE 100 MG PO TABS: ORAL_TABLET | ORAL | Status: DC

## 2015-12-09 MED ORDER — FUROSEMIDE 40 MG PO TABS: ORAL_TABLET | ORAL | Status: DC

## 2015-12-09 NOTE — Progress Notes (Signed)
This a 60 year old gentleman who wants Established here. He's been seeing Dr. Arliss JourneyMcGown in [place] but has recently moved to [place].  Patient is a Freight forwardermotorcycle enthusiast. He's been married twice and is now in a new relationship which seems to be working out well. He's had some legal difficulty seeing his children because of estrangement from his ex-wife's. This is made him quite angry but he says he can control his anger at this point.  Patient has been complaining about bilateral ankle pain. He's had an injury to left ankle which has caused chronic pedal edema. He would like his Lasix refilled to help without swelling. He would also like some help getting the ankle pain alleviated. He is open to seeing an orthopedist which I have arranged.  Patient has been losing a significant amount of weight to see if this would help with his ankle pain. Instead, the pain is getting worse.  Objective: I spent 35 minutes with patient BP 108/80 mmHg  Pulse 95  Temp(Src) 97.6 F (36.4 C) (Oral)  Resp 17  Ht 5\' 10"  (1.778 m)  Wt 252 lb (114.306 kg)  BMI 36.16 kg/m2  SpO2 96% Examination of the ankles reveals moderate swelling in the left ankle with diffuse erythema and some tenderness over the medial malleolus. He has obvious subluxation both ankles.  I did monofilament testing shows some decreased sensation in the left great toe and left second toe. Otherwise patient has normal sensation in his feet.  His pulses in his feet are normal.  Wt Readings from Last 3 Encounters:  12/09/15 252 lb (114.306 kg)  09/12/15 254 lb 6.4 oz (115.395 kg)  08/01/15 269 lb 12.8 oz (122.38 kg)    Pedal edema - Plan: furosemide (LASIX) 40 MG tablet, DISCONTINUED: furosemide (LASIX) 40 MG tablet  Pain in joint, ankle and foot, unspecified laterality - Plan: amitriptyline (ELAVIL) 25 MG tablet, Ambulatory referral to Orthopedic Surgery, DISCONTINUED: amitriptyline (ELAVIL) 25 MG tablet  Subluxation of ankle joint,  right, initial encounter - Plan: POCT SEDIMENTATION RATE, Ambulatory referral to Orthopedic Surgery  Erectile dysfunction, unspecified erectile dysfunction type - Plan: sildenafil (VIAGRA) 100 MG tablet, COMPLETE METABOLIC PANEL WITH GFR  Hyperlipidemia - Plan: Lipid panel  His labs will be done when he said fasting in the next couple days.  Signed, Sheila OatsKurt Ivette Castronova M.D.

## 2015-12-09 NOTE — Patient Instructions (Addendum)
I am referring you to orthopedics.  Your medications have been refilled.

## 2015-12-14 ENCOUNTER — Other Ambulatory Visit (INDEPENDENT_AMBULATORY_CARE_PROVIDER_SITE_OTHER): Payer: Medicare Other

## 2015-12-14 DIAGNOSIS — E785 Hyperlipidemia, unspecified: Secondary | ICD-10-CM

## 2015-12-14 DIAGNOSIS — M2550 Pain in unspecified joint: Secondary | ICD-10-CM | POA: Diagnosis not present

## 2015-12-14 DIAGNOSIS — N529 Male erectile dysfunction, unspecified: Secondary | ICD-10-CM | POA: Diagnosis not present

## 2015-12-14 LAB — LIPID PANEL
Cholesterol: 224 mg/dL — ABNORMAL HIGH (ref 125–200)
HDL: 46 mg/dL (ref >=40)
LDL Cholesterol: 154 mg/dL — ABNORMAL HIGH (ref <130)
Total CHOL/HDL Ratio: 4.9 Ratio (ref <=5.0)
Triglycerides: 122 mg/dL (ref <150)
VLDL: 24 mg/dL (ref <30)

## 2015-12-14 LAB — COMPLETE METABOLIC PANEL WITH GFR
ALT: 20 U/L (ref 9–46)
AST: 19 U/L (ref 10–35)
Albumin: 4 g/dL (ref 3.6–5.1)
Alkaline Phosphatase: 68 U/L (ref 40–115)
BUN: 24 mg/dL (ref 7–25)
CO2: 32 mmol/L — ABNORMAL HIGH (ref 20–31)
Calcium: 9.1 mg/dL (ref 8.6–10.3)
Chloride: 97 mmol/L — ABNORMAL LOW (ref 98–110)
Creat: 1.36 mg/dL — ABNORMAL HIGH (ref 0.70–1.33)
GFR, Est African American: 66 mL/min (ref >=60)
GFR, Est Non African American: 57 mL/min — ABNORMAL LOW (ref >=60)
Glucose, Bld: 96 mg/dL (ref 65–99)
Potassium: 3.3 mmol/L — ABNORMAL LOW (ref 3.5–5.3)
Sodium: 141 mmol/L (ref 135–146)
Total Bilirubin: 0.5 mg/dL (ref 0.2–1.2)
Total Protein: 7 g/dL (ref 6.1–8.1)

## 2015-12-14 LAB — POCT SEDIMENTATION RATE: POCT SED RATE: 5 mm/hr (ref 0–22)

## 2015-12-23 ENCOUNTER — Ambulatory Visit (INDEPENDENT_AMBULATORY_CARE_PROVIDER_SITE_OTHER): Payer: Medicare Other | Admitting: Family Medicine

## 2015-12-23 ENCOUNTER — Telehealth: Payer: Self-pay

## 2015-12-23 VITALS — BP 136/82 | HR 98 | Temp 97.9°F | Resp 18 | Ht 71.0 in | Wt 252.0 lb

## 2015-12-23 DIAGNOSIS — G8929 Other chronic pain: Secondary | ICD-10-CM

## 2015-12-23 DIAGNOSIS — R600 Localized edema: Secondary | ICD-10-CM | POA: Diagnosis not present

## 2015-12-23 DIAGNOSIS — N183 Chronic kidney disease, stage 3 unspecified: Secondary | ICD-10-CM

## 2015-12-23 DIAGNOSIS — M25579 Pain in unspecified ankle and joints of unspecified foot: Secondary | ICD-10-CM

## 2015-12-23 DIAGNOSIS — R609 Edema, unspecified: Secondary | ICD-10-CM | POA: Diagnosis not present

## 2015-12-23 DIAGNOSIS — E876 Hypokalemia: Secondary | ICD-10-CM | POA: Diagnosis not present

## 2015-12-23 DIAGNOSIS — A630 Anogenital (venereal) warts: Secondary | ICD-10-CM | POA: Diagnosis not present

## 2015-12-23 LAB — BASIC METABOLIC PANEL
BUN: 35 mg/dL — ABNORMAL HIGH (ref 7–25)
CO2: 31 mmol/L (ref 20–31)
Calcium: 9.9 mg/dL (ref 8.6–10.3)
Chloride: 93 mmol/L — ABNORMAL LOW (ref 98–110)
Creat: 1.58 mg/dL — ABNORMAL HIGH (ref 0.70–1.33)
Glucose, Bld: 83 mg/dL (ref 65–99)
POTASSIUM: 3.4 mmol/L — AB (ref 3.5–5.3)
SODIUM: 137 mmol/L (ref 135–146)

## 2015-12-23 MED ORDER — FUROSEMIDE 40 MG PO TABS: ORAL_TABLET | ORAL | Status: DC

## 2015-12-23 MED ORDER — IMIQUIMOD 5 % EX CREA: TOPICAL_CREAM | CUTANEOUS | Status: DC

## 2015-12-23 MED ORDER — AMITRIPTYLINE HCL 25 MG PO TABS: 1000.0000 mg | ORAL_TABLET | Freq: Every day | ORAL | Status: DC

## 2015-12-23 MED ORDER — AMITRIPTYLINE HCL 100 MG PO TABS
100.0000 mg | ORAL_TABLET | Freq: Every day | ORAL | Status: DC
Start: 2015-12-23 — End: 2016-10-10

## 2015-12-23 NOTE — Progress Notes (Signed)
By signing my name below I, [name], attest that this documentation has been prepared under the direction and in the presence of [name], MD. Electonically Signed. [name], Scribe 12/23/2015 at 11:09 AM   Subjective:    Patient ID: Peter Briggs, male    DOB: [birth year], 60 y.o.   MRN: [id number]  Chief Complaint  Patient presents with  . Follow-up    Medication and labs  . Foot Pain    HPI Peter Briggs is a 60 y.o. male who presents to the [facility] complaining of diffuse bilat foot pain. Pt states that he has had chronic bilat foot pain that has been worsening over the past few weeks. Pt states it hurts worse upon standing. Pt has history of gout and states that the pain is different. Pt states he has been taking acetaminophen for pain with mild relief. Pt also taking Elavil 25 MG for pain for the past 2 weeks. Pt states no change in pain. Pt reports some mild grogginess during the day.  Pt reports lower leg cramping for the past 4 weeks and is slightly improved.   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  Pt states that he has intentionally lost 100 lbs in the past year through diet and exercise. Pt denies taking any herbal supplements or any pills to help with weight loss.  Pt states he would like to follow up on his lab work from last visit.   PCP is [name],[name], MD  Pt uses a cpap machine due to obstructive sleep apnea. Pt states that he wakes up every 3-4 hrs while sleeping.   Pt was and seen and evaluated by Dr [name] here at [facility] on (12/09/15). At that time, pt was c/o rt ankle pain and upon examination had lower extremity edema, subluxation of both ankles. Per Dr [name] note the pt had chronic edema from an old left ankle injury for which he takes lasix.   Lasix 40 mg BID with great relief to lower extremity edema, per pt. Pt states that he had not been taking lasix up until rt before  his visit 2 weeks ago. Pt has been on the furosemide chronically and only stopped temporarily due to not being able to afford medication for a short period of time.   Pt was referred to orthopedic surgeon. Pt states he has not heard back from the orthopedic surgeon. Pt's sed rate was normal. Pt had CMP drawn and last labs resulted were 12/14/15 showing:  Hypokalemia: Pt's potassium 3.3 at last visit. Pt was advised to eat fresh fruits and vegetables. Pt denies taking any potassium supplements. Pt states that he has been eating bananas.   HLD  Lab Results  Component Value Date   CHOL 224* 12/14/2015   HDL 46 12/14/2015   LDLCALC 154* 12/14/2015   TRIG 122 12/14/2015   CHOLHDL 4.9 12/14/2015   Advised to start walking and eating green leafy vegetables  Elevated creatinine Lab Results  Component Value Date   CREATININE 1.36* 12/14/2015   Down from previous reading of 1.52 which was 8 months ago.   History of chronic kidney disease stage 3 and chronic diastolic heart failure per problem list.   Dr Mayford Knife; Pt's Cardiologist.   Pt also states that he noticed a penile wart for the past 2 years. Pt denies any pain or discharge.    Patient Active Problem List   Diagnosis Date Noted  .  Subluxation of ankle, acquired 12/09/2015  . COPD (chronic obstructive pulmonary disease) (HCC) 01/28/2015  . Chronic diastolic heart failure (HCC) 01/08/2015  . OSA on CPAP 12/28/2014  . Pedal edema 12/28/2014  . Symptomatic bradycardia 06/10/2012  . Anxiety and depression 06/10/2012  . Insomnia 06/10/2012  . Hypertension 06/10/2012  . Obesity 06/10/2012  . Chronic kidney disease, stage III (moderate) 06/10/2012   Past Medical History  Diagnosis Date  . OSA on CPAP     17 cm H20 (Severe OSA with hypoxia down to 67% on sleep study 09/05/13 [name], [name])  . Hypertension   . Hyperlipidemia   . Erectile dysfunction   . Insomnia   . Depression 05/2012     H[facility] of [facility] for [facility] attempt  . Arthritis     need to minimize/avoid NSAIDs due to CRI  . Chronic renal insufficiency, stage II (mild)     Baseline Cr 1.5-1.7--nearly normal for pt's body habitus; saw nephr in [place], [place] 06/2014, renal u/s c/w medicorenal dz  . COPD (chronic obstructive pulmonary disease) (HCC)     [place] ([name])  . History of adenomatous polyp of colon   . Chronic tension headaches   . History of alcohol abuse     Remote past-binge drinker  . Polycythemia   . Chronic diastolic heart failure (HCC) 01/08/2015  . History of restless legs syndrome   . Anxiety    Past Surgical History  Procedure Laterality Date  . Finger surgery    . Toe surgery Right     [name]; great toe MCP joint replacment  . Colonoscopy w/ polypectomy  2011 approx    recall 5 yrs  . Rotator cuff repair Right 2011  . Uvulectomy      did not help OSA  . Tonsillectomy/adenoidectomy/turbinate reduction      did not help OSA  . Cardiovascular stress test  02/03/15    Nuclear stress test: NORMAL (EF 55-65%).  . Transthoracic echocardiogram  12/2014    Normal LV, normal wall motion, EF 55-60%, grade 2 diastolic dysfxn  . Joint replacement    . Fracture surgery     No Known Allergies Prior to Admission medications   Medication Sig Start Date End Date Taking? Authorizing Provider  amitriptyline (ELAVIL) 25 MG tablet 3 tabs po qhs 12/09/15  Yes [name], MD  aspirin EC 81 MG tablet Take 1 tablet (81 mg total) by mouth daily. 10/21/14  Yes Jeoffrey MassedPhilip H [name], MD  furosemide (LASIX) 40 MG tablet 1 tab po bid 12/09/15  Yes [name], MD  Multiple Vitamins-Minerals (MULTIVITAMIN WITH MINERALS) tablet Take 1 tablet by mouth daily.   Yes Historical Provider, MD  sildenafil (VIAGRA) 100 MG tablet 1/2 (half) to 1 (one) tablet by mouth 30 min prior to intercourse Patient not taking: Reported on 12/23/2015 12/09/15    [name], MD   Social History   Social History  . Marital Status: Divorced    Spouse Name: N/A  . Number of Children: N/A  . Years of Education: N/A   Occupational History  . Not on file.   Social History Main Topics  . Smoking status: Former Smoker    Types: Cigars  . Smokeless tobacco: Never Used  . Alcohol Use: No  . Drug Use: No  . Sexual Activity: Not Currently   Other Topics Concern  . Not on file   Social  History Narrative   Divorced, 8 children, 14 grandchildren.   Orig from [place] area.     Relocated to [place] x 2 yrs, returned to [facility] 07/2014.   Occupation: disability (multifactorial medical problems) since 2014.   Used to be Engineer, site (residential and commercial).   Tob: 20 pack-yr hx, quit 1995.     Hx of alcohol abuse in the past; no alc since 2013.  Hx of marijuana use; quit 2010.    No other drug use.       Review of Systems  Constitutional: Negative for fatigue and unexpected weight change.  Eyes: Negative for visual disturbance.  Respiratory: Negative for cough, chest tightness and shortness of breath.   Cardiovascular: Positive for leg swelling (improved). Negative for chest pain and palpitations.  Gastrointestinal: Negative for abdominal pain and blood in stool.  Musculoskeletal:       Positive for bilat foot pain  Neurological: Negative for dizziness, light-headedness and headaches.       Objective:   Physical Exam  Constitutional: He is oriented to person, place, and time. He appears well-developed and well-nourished.  HENT:  Head: Normocephalic and atraumatic.  Eyes: EOM are normal. Pupils are equal, round, and reactive to light.  Neck: No JVD present. Carotid bruit is not present.  Cardiovascular: Normal rate, regular rhythm and normal heart sounds.   No murmur heard. Pulmonary/Chest: Effort normal and breath sounds normal. He has no rales.  Genitourinary:  Pt has 2 elevated verrucous appearing lesions on left penile  shaft. One is 1cm by 4mm, the other is 6mm in diameter. No penile discharge  Musculoskeletal:  bilat lower extremities: No swelling around tibia. Subluxation of both ankles.  Left ankle has diffuse swelling. Left ankle is non-tender. Pt has tenderness on dorsum of left foot. Pt has less than 1 second cap refill on all left toes. Left ankle has decreased ROM. Pt has decreased sensation on left great toe. Pt's rt foot has diffuse swelling. NVI distally rt foot. Pt has rt dorsum and plantar foot dysesthesia.   Neurological: He is alert and oriented to person, place, and time.  Skin: Skin is warm and dry.  Psychiatric: He has a normal mood and affect.  Vitals reviewed.     Filed Vitals:   12/23/15 0917  BP: 136/82  Pulse: 98  Temp: 97.9 F (36.6 C)  TempSrc: Oral  Resp: 18  Height: 5\' 11"  (1.803 m)  Weight: 252 lb (114.306 kg)  SpO2: 97%        Assessment & Plan:   BERNARD SLAYDEN is a 60 y.o. male Pedal edema - Plan: furosemide (LASIX) 40 MG tablet, Basic metabolic panel  - Improved. Decrease to QD dosing for now and may be able to decrease from there. BMP pending.  Pain in joint, ankle and foot, unspecified laterality - Plan: amitriptyline (ELAVIL) 100 MG tablet, DISCONTINUED: amitriptyline (ELAVIL) 25 MG tablet Chronic ankle pain, unspecified laterality - Plan: amitriptyline (ELAVIL) 100 MG tablet  - chronic, ortho eval pending. Can try higher dose of elavil at 100mg  QD, SED.   CKD (chronic kidney disease) stage 3, GFR 30-59 ml/min - Plan: Basic metabolic panel  Hypokalemia - Plan: Basic metabolic panel  Repeat BMP, especially with Lasix dosing recently.  Genital warts - Plan: imiquimod (ALDARA) 5 % cream  - Treatment options discussed, will start with Aldara cream. Side effects discussed. If persistent, biopsy/urology eval.    Meds ordered this encounter  Medications  . furosemide (LASIX) 40  MG tablet    Sig: 1 tab po qd    Dispense:  30 tablet    Refill:   0  . DISCONTD: amitriptyline (ELAVIL) 25 MG tablet    Sig: Take 40 tablets (1,000 mg total) by mouth at bedtime.    Dispense:  120 tablet    Refill:  0    This replaces pt's previous amitriptyline rx  . amitriptyline (ELAVIL) 100 MG tablet    Sig: Take 1 tablet (100 mg total) by mouth at bedtime.    Dispense:  30 tablet    Refill:  0  . imiquimod (ALDARA) 5 % cream    Sig: Apply topically 3 (three) times a week. Max 16 weeks. Apply until total clearance of lesions.    Dispense:  24 each    Refill:  1   Patient Instructions       IF you received an x-ray today, you will receive an invoice from [facility]. Please contact [facility] at [phone] with questions or concerns regarding your invoice.   IF you received labwork today, you will receive an invoice from [employer] [facility]. Please contact [facility] at [phone] with questions or concerns regarding your invoice.   Our billing staff will not be able to assist you with questions regarding bills from these companies.  You will be contacted with the lab results as soon as they are available. The fastest way to get your results is to activate your My Chart account. Instructions are located on the last page of this paperwork. If you have not heard from [name] regarding the results in 2 weeks, please contact this office.    Follow-up with orthopedics for your ankle pain. You can try increasing amitriptyline to 100 mg at night. If increase in fatigue or tiredness during the day, may need to return to previous dose. Follow-up with Dr.Lauenstein in the next 2 weeks to see how this is going.  Decrease your furosemide to once per day as swelling does not appear to be that bad right now. Follow-up again with Dr. [name] in next 2 weeks.   We will check her potassium level, but if any increased muscle cramps, or new symptoms, return for recheck here or the emergency room.  The areas on the penis  appears to be genital warts. See information on this below, but we can try the topical cream initially. If you have too much irritation of the skin or not tolerating the medication, return to discuss other options.  Return to the clinic or go to the nearest emergency room if any of your symptoms worsen or new symptoms occur.  Delbert HarnessGinette Otto Office [phone] [address] [place], [place] [address]  Genital Warts Genital warts are a common STD (sexually transmitted disease). They may appear as small bumps on the tissues of the genital area or anal area. Sometimes, they can become irritated and cause pain. Genital warts are easily passed to other people through sexual contact. Getting treatment is important because genital warts can lead to other problems. In females, the virus that causes genital warts may increase the risk of cervical cancer. CAUSES Genital warts are caused by a virus that is called human papillomavirus (HPV). HPV is spread by having unprotected sex with an infected person. It can be spread through vaginal, anal, and oral sex. Many people do not know that they are infected. They may be infected for years without problems. However, even if they do not have problems,  they can pass the infection to their sexual partners. RISK FACTORS Genital warts are more likely to develop in:  People who have unprotected sex.  People who have multiple sexual partners.  People who become sexually active before they are 59 years of age.  Men who are not circumcised.  Women who have a male sexual partner who is not circumcised.  People who have a weakened body defense system (immune system) due to disease or medicine.  People who smoke. SYMPTOMS Symptoms of genital warts include:  Small growths in the genital area or anal area. These warts often grow in clusters.  Itching and irritation in the genital area or anal area.  Bleeding from the warts.  Painful sexual  intercourse. DIAGNOSIS Genital warts can usually be diagnosed from their appearance on the vagina, vulva, penis, perineum, anus, or rectum. Tests may also be done, such as:  Biopsy. A tissue sample is removed so it can be looked at under a microscope.  Colposcopy. In females, a magnifying tool is used to examine the vagina and cervix. Certain solutions may be used to make the HPV cells change color so they can be seen more easily.  A Pap test in females.  Tests for other STDs. TREATMENT Treatment for genital warts may include:  Applying prescription medicines to the warts. These may be solutions or creams.  Freezing the warts with liquid nitrogen (cryotherapy).  Burning the warts with:  Laser treatment.  An electrified probe (electrocautery).  Injecting a substance (Candida antigen or Trichophyton antigen) into the warts to help the body's immune system to fight off the warts.  Interferon injections.  Surgery to remove the warts. HOME CARE INSTRUCTIONS Medicines  Apply over-the-counter and prescription medicines only as told by your health care provider.  Do not treat genital warts with medicines that are used for treating hand warts.  Talk with your health care provider about using over-the-counter anti-itch creams. General Instructions  Do not touch or scratch the warts.  Do not have sex until your treatment has been completed.  Tell your current and past sexual partners about your condition because they may also need treatment.  Keep all follow-up visits as told by your health care provider. This is important.  After treatment, use condoms during sex to prevent future infections. Other Instructions for Women  Women who have genital warts might need increased screening for cervical cancer. This type of cancer is slow growing and can be cured if it is found early. Chances of developing cervical cancer are increased with HPV.  If you become pregnant, tell your  health care provider that you have had HPV. Your health care provider will monitor you closely during pregnancy to be sure that your baby is safe. PREVENTION Talk with your health care provider about getting the HPV vaccines. These vaccines prevent some HPV infections and cancers. It is recommended that the vaccine be given to males and females who are 6-38 years of age. It will not work if you already have HPV, and it is not recommended for pregnant women. SEEK MEDICAL CARE IF:  You have redness, swelling, or pain in the area of the treated skin.  You have a fever.  You feel generally ill.  You feel lumps in and around your genital area or anal area.  You have bleeding in your genital area or anal area.  You have pain during sexual intercourse.   This information is not intended to replace advice given to you by your  health care provider. Make sure you discuss any questions you have with your health care provider.   Document Released: 08/11/2000 Document Revised: 05/05/2015 Document Reviewed: 11/09/2014 Elsevier Interactive Patient Education [employer]     I personally performed the services described in this documentation, which was scribed in my presence. The recorded information has been reviewed and considered, and addended by me as needed.

## 2015-12-23 NOTE — Telephone Encounter (Signed)
Dr. Neva SeatGreene asked if I could call the pharmacy and inform them that the first script it sent for Elavil that stated take 40 tab.s at bedtime was sent in error, and he sent in the correct script. I spoke with someone at the pharmacy

## 2015-12-23 NOTE — Patient Instructions (Addendum)
IF you received an x-ray today, you will receive an invoice from [facility]. Please contact [facility] at [phone] with questions or concerns regarding your invoice.   IF you received labwork today, you will receive an invoice from [facility]. Please contact [facility] at [phone] with questions or concerns regarding your invoice.   Our billing staff will not be able to assist you with questions regarding bills from these companies.  You will be contacted with the lab results as soon as they are available. The fastest way to get your results is to activate your My Chart account. Instructions are located on the last page of this paperwork. If you have not heard from us regarding the results in 2 weeks, please contact this office.    Follow-up with orthopedics for your ankle pain. You can try increasing amitriptyline to 100 mg at night. If increase in fatigue or tiredness during the day, may need to return to previous dose. Follow-up with Dr.Lauenstein in the next 2 weeks to see how this is going.  Decrease your furosemide to once per day as swelling does not appear to be that bad right now. Follow-up again with Dr. Milus GlazierLauenstein in next 2 weeks.   We will check her potassium level, but if any increased muscle cramps, or new symptoms, return for recheck here or the emergency room.  The areas on the penis appears to be genital warts. See information on this below, but we can try the topical cream initially. If you have too much irritation of the skin or not tolerating the medication, return to discuss other options.  Return to the clinic or go to the nearest emergency room if any of your symptoms worsen or new symptoms occur.  Peter HarnessMurphy WainerGinette Briggs: [place] Office [phone] [address] [place], [place] [id number]  Genital Warts Genital warts are a common STD (sexually transmitted disease). They may appear as small bumps on the tissues of  the genital area or anal area. Sometimes, they can become irritated and cause pain. Genital warts are easily passed to other people through sexual contact. Getting treatment is important because genital warts can lead to other problems. In females, the virus that causes genital warts may increase the risk of cervical cancer. CAUSES Genital warts are caused by a virus that is called human papillomavirus (HPV). HPV is spread by having unprotected sex with an infected person. It can be spread through vaginal, anal, and oral sex. Many people do not know that they are infected. They may be infected for years without problems. However, even if they do not have problems, they can pass the infection to their sexual partners. RISK FACTORS Genital warts are more likely to develop in:  People who have unprotected sex.  People who have multiple sexual partners.  People who become sexually active before they are 59 years of age.  Men who are not circumcised.  Women who have a male sexual partner who is not circumcised.  People who have a weakened body defense system (immune system) due to disease or medicine.  People who smoke. SYMPTOMS Symptoms of genital warts include:  Small growths in the genital area or anal area. These warts often grow in clusters.  Itching and irritation in the genital area or anal area.  Bleeding from the warts.  Painful sexual intercourse. DIAGNOSIS Genital warts can usually be diagnosed from their appearance on the vagina, vulva, penis, perineum, anus, or rectum. Tests may also be done,  such as:  Biopsy. A tissue sample is removed so it can be looked at under a microscope.  Colposcopy. In females, a magnifying tool is used to examine the vagina and cervix. Certain solutions may be used to make the HPV cells change color so they can be seen more easily.  A Pap test in females.  Tests for other STDs. TREATMENT Treatment for genital warts may include:  Applying  prescription medicines to the warts. These may be solutions or creams.  Freezing the warts with liquid nitrogen (cryotherapy).  Burning the warts with:  Laser treatment.  An electrified probe (electrocautery).  Injecting a substance (Candida antigen or Trichophyton antigen) into the warts to help the body's immune system to fight off the warts.  Interferon injections.  Surgery to remove the warts. HOME CARE INSTRUCTIONS Medicines  Apply over-the-counter and prescription medicines only as told by your health care provider.  Do not treat genital warts with medicines that are used for treating hand warts.  Talk with your health care provider about using over-the-counter anti-itch creams. General Instructions  Do not touch or scratch the warts.  Do not have sex until your treatment has been completed.  Tell your current and past sexual partners about your condition because they may also need treatment.  Keep all follow-up visits as told by your health care provider. This is important.  After treatment, use condoms during sex to prevent future infections. Other Instructions for Women  Women who have genital warts might need increased screening for cervical cancer. This type of cancer is slow growing and can be cured if it is found early. Chances of developing cervical cancer are increased with HPV.  If you become pregnant, tell your health care provider that you have had HPV. Your health care provider will monitor you closely during pregnancy to be sure that your baby is safe. PREVENTION Talk with your health care provider about getting the HPV vaccines. These vaccines prevent some HPV infections and cancers. It is recommended that the vaccine be given to males and females who are 36-74 years of age. It will not work if you already have HPV, and it is not recommended for pregnant women. SEEK MEDICAL CARE IF:  You have redness, swelling, or pain in the area of the treated  skin.  You have a fever.  You feel generally ill.  You feel lumps in and around your genital area or anal area.  You have bleeding in your genital area or anal area.  You have pain during sexual intercourse.   This information is not intended to replace advice given to you by your health care provider. Make sure you discuss any questions you have with your health care provider.   Document Released: 08/11/2000 Document Revised: 05/05/2015 Document Reviewed: 11/09/2014 Elsevier Interactive Patient Education [employer]

## 2015-12-25 NOTE — Progress Notes (Signed)
Quick Note:  LMOVM - pt has been in since Lab visit. Stated we wanted to make sure we review his labs. Gave message per Dr. Milus GlazierLauenstein. Asked pt to return call to clinic with any questions. ______

## 2016-01-11 ENCOUNTER — Telehealth: Payer: Self-pay

## 2016-01-11 NOTE — Telephone Encounter (Signed)
Please call patient back about his lab results please.

## 2016-01-11 NOTE — Telephone Encounter (Signed)
Attempted to contact pt, left VM to call back asap

## 2016-01-12 ENCOUNTER — Encounter: Payer: Self-pay | Admitting: Radiology

## 2016-01-12 ENCOUNTER — Telehealth: Payer: Self-pay | Admitting: Radiology

## 2016-01-12 DIAGNOSIS — N189 Chronic kidney disease, unspecified: Secondary | ICD-10-CM

## 2016-01-12 DIAGNOSIS — E876 Hypokalemia: Secondary | ICD-10-CM

## 2016-01-12 DIAGNOSIS — R7989 Other specified abnormal findings of blood chemistry: Secondary | ICD-10-CM

## 2016-01-12 MED ORDER — POTASSIUM CHLORIDE ER 10 MEQ PO TBCR: 10.0000 meq | EXTENDED_RELEASE_TABLET | Freq: Every day | ORAL | Status: DC

## 2016-01-12 NOTE — Telephone Encounter (Signed)
Pt called back and I reviewed results with him. He does not currently have a nephrologist in [place] and would like a referral.

## 2016-01-12 NOTE — Telephone Encounter (Signed)
I can refer to nephrology, but in the meantime, recommend potassium supplement once per day. I will send this in and return for lab only visit in next 1 week to recheck potassium and kidney functiuon- sooner if any new muscle cramps, fatigue, or worsening sx's.

## 2016-01-13 ENCOUNTER — Telehealth: Payer: Self-pay

## 2016-01-13 NOTE — Telephone Encounter (Signed)
Expand All Collapse All   Spoke with pt and gave information per Dr. Paralee CancelGreene's message.  Advised KCl called in to [facility]. Pt will return in 1 week for labs only - Kcl level and kidney function

## 2016-01-13 NOTE — Telephone Encounter (Signed)
  Spoke with pt and gave information per Dr. Paralee Cancel message.  Advised KCl called in to [facility]. Pt will return in 1 week for labs only - Kcl level and kidney function

## 2016-01-13 NOTE — Telephone Encounter (Signed)
Spoke with pt and gave information per Dr. Paralee CancelGreene's message.  Advised KCl called in to [facility]. Pt will return in 1 week for labs only - Kcl level and kidney function

## 2016-02-17 ENCOUNTER — Ambulatory Visit (INDEPENDENT_AMBULATORY_CARE_PROVIDER_SITE_OTHER): Payer: Medicare Other

## 2016-02-17 ENCOUNTER — Ambulatory Visit (INDEPENDENT_AMBULATORY_CARE_PROVIDER_SITE_OTHER): Payer: Medicare Other | Admitting: Family Medicine

## 2016-02-17 VITALS — BP 122/72 | HR 97 | Temp 98.0°F | Resp 17 | Ht 71.5 in | Wt 261.0 lb

## 2016-02-17 DIAGNOSIS — N183 Chronic kidney disease, stage 3 unspecified: Secondary | ICD-10-CM

## 2016-02-17 DIAGNOSIS — M7989 Other specified soft tissue disorders: Secondary | ICD-10-CM | POA: Diagnosis not present

## 2016-02-17 DIAGNOSIS — L03031 Cellulitis of right toe: Secondary | ICD-10-CM | POA: Diagnosis not present

## 2016-02-17 LAB — POCT CBC
Granulocyte percent: 76.4 %G (ref 37–80)
HCT, POC: 49.1 % (ref 43.5–53.7)
HEMOGLOBIN: 17.4 g/dL (ref 14.1–18.1)
Lymph, poc: 2 (ref 0.6–3.4)
MCH: 31.8 pg — AB (ref 27–31.2)
MCHC: 35.5 g/dL — AB (ref 31.8–35.4)
MCV: 89.6 fL (ref 80–97)
MID (cbc): 0.6 (ref 0–0.9)
MPV: 7.1 fL (ref 0–99.8)
PLATELET COUNT, POC: 214 10*3/uL (ref 142–424)
POC Granulocyte: 8.6 — AB (ref 2–6.9)
POC LYMPH PERCENT: 18.1 %L (ref 10–50)
POC MID %: 5.5 %M (ref 0–12)
RBC: 5.49 M/uL (ref 4.69–6.13)
RDW, POC: 14.6 %
WBC: 11.3 10*3/uL — AB (ref 4.6–10.2)

## 2016-02-17 LAB — C-REACTIVE PROTEIN: CRP: 0.7 mg/dL — AB (ref <0.60)

## 2016-02-17 LAB — COMPREHENSIVE METABOLIC PANEL
ALT: 20 U/L (ref 9–46)
AST: 20 U/L (ref 10–35)
Albumin: 3.9 g/dL (ref 3.6–5.1)
Alkaline Phosphatase: 67 U/L (ref 40–115)
BILIRUBIN TOTAL: 0.5 mg/dL (ref 0.2–1.2)
BUN: 20 mg/dL (ref 7–25)
CHLORIDE: 98 mmol/L (ref 98–110)
CO2: 31 mmol/L (ref 20–31)
Calcium: 9.4 mg/dL (ref 8.6–10.3)
Creat: 1.49 mg/dL — ABNORMAL HIGH (ref 0.70–1.33)
Glucose, Bld: 78 mg/dL (ref 65–99)
Potassium: 3.8 mmol/L (ref 3.5–5.3)
SODIUM: 138 mmol/L (ref 135–146)
Total Protein: 6.7 g/dL (ref 6.1–8.1)

## 2016-02-17 LAB — POCT SEDIMENTATION RATE: POCT SED RATE: 14 mm/h (ref 0–22)

## 2016-02-17 MED ORDER — CEFTRIAXONE SODIUM 1 G IJ SOLR
1.0000 g | Freq: Once | INTRAMUSCULAR | Status: AC
  Administered 2016-02-17: 1 g via INTRAMUSCULAR

## 2016-02-17 MED ORDER — DOXYCYCLINE HYCLATE 100 MG PO CAPS: 100.0000 mg | ORAL_CAPSULE | Freq: Two times a day (BID) | ORAL | Status: DC

## 2016-02-17 NOTE — Patient Instructions (Addendum)
     IF you received an x-ray today, you will receive an invoice from [facility]. Please contact [facility] at [phone] with questions or concerns regarding your invoice.   IF you received labwork today, you will receive an invoice from [facility]. Please contact [facility] at [phone] with questions or concerns regarding your invoice.   Our billing staff will not be able to assist you with questions regarding bills from these companies.  You will be contacted with the lab results as soon as they are available. The fastest way to get your results is to activate your My Chart account. Instructions are located on the last page of this paperwork. If you have not heard from us regarding the results in 2 weeks, please contact this office.      Cellulitis Cellulitis is an infection of the skin and the tissue beneath it. The infected area is usually red and tender. Cellulitis occurs most often in the arms and lower legs.  CAUSES  Cellulitis is caused by bacteria that enter the skin through cracks or cuts in the skin. The most common types of bacteria that cause cellulitis are staphylococci and streptococci. SIGNS AND SYMPTOMS   Redness and warmth.  Swelling.  Tenderness or pain.  Fever. DIAGNOSIS  Your health care provider can usually determine what is wrong based on a physical exam. Blood tests may also be done. TREATMENT  Treatment usually involves taking an antibiotic medicine. HOME CARE INSTRUCTIONS   Take your antibiotic medicine as directed by your health care provider. Finish the antibiotic even if you start to feel better.  Keep the infected arm or leg elevated to reduce swelling.  Apply a warm cloth to the affected area up to 4 times per day to relieve pain.  Take medicines only as directed by your health care provider.  Keep all follow-up visits as directed by your health care provider. SEEK MEDICAL CARE IF:   You  notice red streaks coming from the infected area.  Your red area gets larger or turns dark in color.  Your bone or joint underneath the infected area becomes painful after the skin has healed.  Your infection returns in the same area or another area.  You notice a swollen bump in the infected area.  You develop new symptoms.  You have a fever. SEEK IMMEDIATE MEDICAL CARE IF:   You feel very sleepy.  You develop vomiting or diarrhea.  You have a general ill feeling (malaise) with muscle aches and pains.   This information is not intended to replace advice given to you by your health care provider. Make sure you discuss any questions you have with your health care provider.   Document Released: 05/24/2005 Document Revised: 05/05/2015 Document Reviewed: 10/30/2011 Elsevier Interactive Patient Education 2016 Elsevier Inc.  

## 2016-02-17 NOTE — Progress Notes (Signed)
Subjective:    Patient ID: Peter Briggs, male    DOB: 07-Aug-1956, 60 y.o.   MRN: [id number]  02/17/2016  Toe Injury   HPI This 60 y.o. male presents for evaluation of R second toe pain and swelling.  S/p first R toe surgery.  Then second toe has hammered.  Area has become swollen, red, and tender.  No active drainage; no fever/chills/sweats.   Has 8 children; always the strong one for the family.     Review of Systems  Constitutional: Negative for fever, chills, diaphoresis and fatigue.  Musculoskeletal: Positive for arthralgias.  Skin: Positive for color change and wound.    Past Medical History  Diagnosis Date  . OSA on CPAP     17 cm H20 (Severe OSA with hypoxia down to 67% on sleep study 09/05/13 done by [facility], [name])  . Hypertension   . Hyperlipidemia   . Erectile dysfunction   . Insomnia   . Depression 05/2012    Hx of [facility] stay at [facility] for [facility] attempt  . Arthritis     need to minimize/avoid NSAIDs due to CRI  . Chronic renal insufficiency, stage II (mild)     Baseline Cr 1.5-1.7--nearly normal for pt's body habitus; saw neph[place] in [place] 06/2014, renal u/s c/w medicorenal dz  . COPD (chronic obstructive pulmonary disease) (HCC)     [place] ([name])  . History of adenomatous polyp of colon   . Chronic tension headaches   . History of alcohol abuse     Remote past-binge drinker  . Polycythemia   . Chronic diastolic heart failure (HCC) 01/08/2015  . History of restless legs syndrome   . Anxiety    Past Surgical History  Procedure Laterality Date  . Finger surgery    . Toe surgery Right     [name]; great toe MCP joint replacment  . Colonoscopy w/ polypectomy  2011 approx    recall 5 yrs  . Rotator cuff repair Right 2011  . Uvulectomy      did not help OSA  . Tonsillectomy/adenoidectomy/turbinate reduction      did not help OSA  .  Cardiovascular stress test  02/03/15    Nuclear stress test: NORMAL (EF 55-65%).  . Transthoracic echocardiogram  12/2014    Normal LV, normal wall motion, EF 55-60%, grade 2 diastolic dysfxn  . Joint replacement    . Fracture surgery     No Known Allergies  Social History   Social History  . Marital Status: Divorced    Spouse Name: N/A  . Number of Children: N/A  . Years of Education: N/A   Occupational History  . Not on file.   Social History Main Topics  . Smoking status: Former Smoker    Types: Cigars  . Smokeless tobacco: Never Used  . Alcohol Use: No  . Drug Use: No  . Sexual Activity: Not Currently   Other Topics Concern  . Not on file   Social History Narrative   Divorced, 8 children, 14 grandchildren.   Orig from [place] area.     [place], [place]   Occupation: disability (multifactorial medical problems) since 2014.   Used to be Engineer, siteHVAC technician (residential and commercial).   Tob: 20 pack-yr hx, quit 1995.     Hx of alcohol abuse in the past; no alc since 2013.  Hx  of marijuana use; quit 2010.    No other drug use.   Family History  Problem Relation Age of Onset  . COPD Mother   . Asthma Daughter   . Sleep apnea Son   . Asthma Daughter   . Kidney disease Daughter   . Sleep apnea Son        Objective:    BP 122/72 mmHg  Pulse 97  Temp(Src) 98 F (36.7 C) (Oral)  Resp 17  Ht 5' 11.5" (1.816 m)  Wt 261 lb (118.389 kg)  BMI 35.90 kg/m2  SpO2 97% Physical Exam  Constitutional: He is oriented to person, place, and time. He appears well-developed and well-nourished. No distress.  HENT:  Head: Normocephalic and atraumatic.  Eyes: Conjunctivae and EOM are normal. Pupils are equal, round, and reactive to light.  Neck: Normal range of motion. Neck supple. Carotid bruit is not present. No thyromegaly present.  Cardiovascular: Normal rate, regular rhythm, normal heart sounds and intact distal pulses.  Exam reveals  no gallop and no friction rub.   No murmur heard. Pulmonary/Chest: Effort normal and breath sounds normal. He has no wheezes. He has no rales.  Musculoskeletal:  R second toe with hammer deformity with distal swelling moderate to severe.  Lymphadenopathy:    He has no cervical adenopathy.  Neurological: He is alert and oriented to person, place, and time. No cranial nerve deficit.  Skin: Skin is warm and dry. No rash noted. He is not diaphoretic. There is erythema.  R second toe with diffuse swelling, erythema, tenderness.  Psychiatric: He has a normal mood and affect. His behavior is normal.  Nursing note and vitals reviewed.  Dg Toe 2nd Right  02/17/2016  CLINICAL DATA:  Right great toe swelling EXAM: RIGHT SECOND TOE COMPARISON:  None. FINDINGS: First metatarsophalangeal joint total arthroplasty is in place. There is lucency surrounding the metatarsal and proximal phalangeal prosthetic elements suggesting loosening. No fracture. No dislocation. Degenerative changes in the IP joints. IMPRESSION: First metatarsophalangeal joint total arthroplasty may be loose as described above. No fracture. Electronically Signed   By: Jolaine ClickArthur  Hoss M.D.   On: 02/17/2016 17:02        Assessment & Plan:   1. Cellulitis of second toe, right   2. Chronic kidney disease, stage III (moderate)    -new. -s/p Rocephin in office. -rx for Doxy provided.  -RTC 24 hours if no improvement and sooner if worse.   Orders Placed This Encounter  Procedures  . DG Toe 2nd Right    Standing Status: Future     Number of Occurrences: 1     Standing Expiration Date: 02/16/2017    Order Specific Question:  Reason for Exam (SYMPTOM  OR DIAGNOSIS REQUIRED)    Answer:  R hanner toe with abscess/infection; rule out osteo    Order Specific Question:  Preferred imaging location?    Answer:  External  . Comprehensive metabolic panel  . C-reactive protein  . POCT CBC  . POCT SEDIMENTATION RATE   Meds ordered this  encounter  Medications  . cefTRIAXone (ROCEPHIN) injection 1 g    Sig:   . doxycycline (VIBRAMYCIN) 100 MG capsule    Sig: Take 1 capsule (100 mg total) by mouth 2 (two) times daily.    Dispense:  20 capsule    Refill:  0    No Follow-up on file.    Ashlinn Hemrick Paulita FujitaMartin Jenese Mischke, M.D. [facility]  [place] [address] [place], [place]  [address] [phone] phone [phone] fax

## 2016-03-13 ENCOUNTER — Encounter: Payer: Self-pay | Admitting: Family Medicine

## 2016-05-22 DIAGNOSIS — N183 Chronic kidney disease, stage 3 (moderate): Secondary | ICD-10-CM | POA: Diagnosis not present

## 2016-05-22 DIAGNOSIS — I10 Essential (primary) hypertension: Secondary | ICD-10-CM | POA: Diagnosis not present

## 2016-08-05 ENCOUNTER — Ambulatory Visit: Payer: Medicare Other

## 2016-08-05 ENCOUNTER — Ambulatory Visit (INDEPENDENT_AMBULATORY_CARE_PROVIDER_SITE_OTHER): Payer: Medicare Other

## 2016-08-05 ENCOUNTER — Ambulatory Visit (INDEPENDENT_AMBULATORY_CARE_PROVIDER_SITE_OTHER): Payer: Medicare Other | Admitting: Family Medicine

## 2016-08-05 VITALS — BP 132/80 | HR 112 | Temp 99.2°F | Resp 17 | Ht 71.5 in | Wt 277.0 lb

## 2016-08-05 DIAGNOSIS — M79671 Pain in right foot: Secondary | ICD-10-CM

## 2016-08-05 DIAGNOSIS — M7989 Other specified soft tissue disorders: Secondary | ICD-10-CM

## 2016-08-05 DIAGNOSIS — E876 Hypokalemia: Secondary | ICD-10-CM

## 2016-08-05 DIAGNOSIS — E790 Hyperuricemia without signs of inflammatory arthritis and tophaceous disease: Secondary | ICD-10-CM | POA: Diagnosis not present

## 2016-08-05 DIAGNOSIS — D72829 Elevated white blood cell count, unspecified: Secondary | ICD-10-CM | POA: Diagnosis not present

## 2016-08-05 DIAGNOSIS — M79672 Pain in left foot: Secondary | ICD-10-CM

## 2016-08-05 DIAGNOSIS — N183 Chronic kidney disease, stage 3 unspecified: Secondary | ICD-10-CM

## 2016-08-05 LAB — POCT CBC
GRANULOCYTE PERCENT: 81.9 % — AB (ref 37–80)
HCT, POC: 49.9 % (ref 43.5–53.7)
HEMOGLOBIN: 18 g/dL (ref 14.1–18.1)
Lymph, poc: 1.8 (ref 0.6–3.4)
MCH: 31.9 pg — AB (ref 27–31.2)
MCHC: 36.2 g/dL — AB (ref 31.8–35.4)
MCV: 88.2 fL (ref 80–97)
MID (CBC): 0.6 (ref 0–0.9)
MPV: 7.1 fL (ref 0–99.8)
PLATELET COUNT, POC: 250 10*3/uL (ref 142–424)
POC Granulocyte: 10.9 — AB (ref 2–6.9)
POC LYMPH PERCENT: 13.8 %L (ref 10–50)
POC MID %: 4.3 %M (ref 0–12)
RBC: 5.66 M/uL (ref 4.69–6.13)
RDW, POC: 13.3 %
WBC: 13.3 10*3/uL — AB (ref 4.6–10.2)

## 2016-08-05 MED ORDER — DOXYCYCLINE HYCLATE 100 MG PO TABS: 100.0000 mg | ORAL_TABLET | Freq: Two times a day (BID) | ORAL | 0 refills | Status: DC

## 2016-08-05 MED ORDER — CEFTRIAXONE SODIUM 1 G IJ SOLR
1.0000 g | Freq: Once | INTRAMUSCULAR | Status: AC
  Administered 2016-08-05: 1 g via INTRAMUSCULAR

## 2016-08-05 MED ORDER — HYDROCODONE-ACETAMINOPHEN 5-325 MG PO TABS: 1.0000 | ORAL_TABLET | Freq: Four times a day (QID) | ORAL | 0 refills | Status: DC | PRN

## 2016-08-05 NOTE — Progress Notes (Signed)
Subjective:  By signing my name below, I, Stann Oresung-Kai Tsai, attest that this documentation has been prepared under the direction and in the presence of Meredith StaggersJeffrey Honestee Revard, MD. Electronically Signed: Stann Oresung-Kai Tsai, Scribe. 08/05/2016 , 1:21 PM .  Patient was seen in Room 10 .   Patient ID: Peter Briggs, male    DOB: 08/24/1956, 60 y.o.   MRN: [id number] Chief Complaint  Patient presents with  . Foot Swelling    gout    HPI Peter NipChristopher C Cullars is a 60 y.o. male Patient has history of chronic kidney disease, chronic diastolic heart failure. COPD, HTN, and obesity. He presents for possible gout flare.   Foot Pain He was seen in Dec 2016 for right foot pain, but that was after an injury; noted to have a small fracture in his navicula. He was also seen on Jan 15th with a 3-day history of left foot swelling with pain and redness without any known injury, suspected gout flare. He was treated with prednisone and his uric acid was 9.0 at that time. He last saw me in April of this year with bilateral foot pain. He was referred to orthopedics at that time for his ankle pain.   He was seen on June 22nd by Dr. Katrinka BlazingSmith for right second toe pain and swelling, suspected cellulitis. He was treated with Rocephin injection in office and doxycycline for 10 days.   Today Patient reports bilateral feet pain and swelling that started 6 days ago, right worse than left. He notes wearing new motorcycle boots 6 days ago, wearing to church and then wore it home. He denies any pain when wearing his boots, "it felt great wearing them". But when he went home and took off his boots, he started having pain in his mid foot with light swelling. He denies feeling a pop at that time. He reports more swelling starting yesterday.   His left foot is painful all over as well. He reports pain initially in right foot, then pain into his right foot. He also notes tingling in his feet, and feeling cold to him. His wife states his feet  are warm to the touch though. He's tried soaking his feet in epsom salt, elevating them, and staying off of them without any improvement. He denies any fever or chills. He describes his pain similar to gout.   [facility] in April but states never receiving a call from them.   He was taking amitriptyline as prescribed, but it makes him feel groggy the next day.   Chronic kidney disease He was referred to nephrology in May with his creatinine at 1.58 at that time. He was stable at 1.49 at June visit with Dr. Katrinka BlazingSmith.   Hypokalemia See prior notes; potassium stable at 3.8 in June.   Patient Active Problem List   Diagnosis Date Noted  . Subluxation of ankle, acquired 12/09/2015  . COPD (chronic obstructive pulmonary disease) (HCC) 01/28/2015  . Chronic diastolic heart failure (HCC) 01/08/2015  . OSA on CPAP 12/28/2014  . Pedal edema 12/28/2014  . Symptomatic bradycardia 06/10/2012  . Anxiety and depression 06/10/2012  . Insomnia 06/10/2012  . Hypertension 06/10/2012  . Obesity 06/10/2012  . Chronic kidney disease, stage III (moderate) 06/10/2012   Past Medical History:  Diagnosis Date  . Anxiety   . Arthritis    need to minimize/avoid NSAIDs due to CRI  . Chronic diastolic heart failure (HCC) 01/08/2015  . Chronic renal insufficiency, stage II (mild)  Baseline Cr 1.5-1.7--nearly normal for pt's body habitus; saw nephr in [place], [place] 06/2014, renal u/s c/w medicorenal dz  . Chronic tension headaches   . COPD (chronic obstructive pulmonary disease) (HCC)    saw pulmonologist in [place], [place] (Dr. Earley Favor)  . Depression 05/2012   Hx of inpatient stay at [facility] for suicide attempt  . Erectile dysfunction   . History of adenomatous polyp of colon   . History of alcohol abuse    Remote past-binge drinker  . History of restless legs syndrome   . Hyperlipidemia   . Hypertension   . Insomnia   . OSA on CPAP    17 cm H20 (Severe  OSA with hypoxia down to 67% on sleep study 09/05/13 done by [facility] and Sleep medicine, Dr. Marene Lenz)  . Polycythemia    Past Surgical History:  Procedure Laterality Date  . CARDIOVASCULAR STRESS TEST  02/03/15   Nuclear stress test: NORMAL (EF 55-65%).  . COLONOSCOPY W/ POLYPECTOMY  2011 approx   recall 5 yrs  . FINGER SURGERY    . FRACTURE SURGERY    . JOINT REPLACEMENT    . ROTATOR CUFF REPAIR Right 2011  . TOE SURGERY Right    Dr. Forest Becker; great toe MCP joint replacment  . TONSILLECTOMY/ADENOIDECTOMY/TURBINATE REDUCTION     did not help OSA  . TRANSTHORACIC ECHOCARDIOGRAM  12/2014   Normal LV, normal wall motion, EF 55-60%, grade 2 diastolic dysfxn  . UVULECTOMY     did not help OSA   No Known Allergies Prior to Admission medications   Medication Sig Start Date End Date Taking? Authorizing Provider  amitriptyline (ELAVIL) 100 MG tablet Take 1 tablet (100 mg total) by mouth at bedtime. 12/23/15  Yes [name], MD  aspirin EC 81 MG tablet Take 1 tablet (81 mg total) by mouth daily. 10/21/14  Yes [name], MD  furosemide (LASIX) 40 MG tablet 1 tab po qd 12/23/15  Yes [name], MD  imiquimod Mathis Dad) 5 % cream Apply topically 3 (three) times a week. Max 16 weeks. Apply until total clearance of lesions. 12/23/15  Yes [name], MD  Multiple Vitamins-Minerals (MULTIVITAMIN WITH MINERALS) tablet Take 1 tablet by mouth daily.   Yes Historical Provider, MD   Social History   Social History  . Marital status: Divorced    Spouse name: N/A  . Number of children: N/A  . Years of education: N/A   Occupational History  . Not on file.   Social History Main Topics  . Smoking status: Former Smoker    Types: Cigars  . Smokeless tobacco: Never Used  . Alcohol use No  . Drug use: No  . Sexual activity: Not Currently   Other Topics Concern  . Not on file   Social History Narrative   Divorced, 8 children, 14 grandchildren.   Orig from [place] area.      Relocated to [place] x 2 yrs, returned to [facility] 07/2014.   Occupation: disability (multifactorial medical problems) since 2014.   Used to be Engineer, site (residential and commercial).   Tob: 20 pack-yr hx, quit 1995.     Hx of alcohol abuse in the past; no alc since 2013.  Hx of marijuana use; quit 2010.    No other drug use.   Review of Systems  Constitutional: Negative for fatigue and unexpected weight change.  Eyes: Negative for visual disturbance.  Respiratory: Negative for cough, chest tightness and shortness  of breath.   Cardiovascular: Negative for chest pain, palpitations and leg swelling.  Gastrointestinal: Negative for abdominal pain and blood in stool.  Musculoskeletal: Positive for arthralgias, gait problem, joint swelling and myalgias.  Skin: Negative for rash and wound.  Neurological: Negative for dizziness, weakness, light-headedness and headaches.       Objective:   Physical Exam  Constitutional: He is oriented to person, place, and time. He appears well-developed and well-nourished. No distress.  HENT:  Head: Normocephalic and atraumatic.  Eyes: EOM are normal. Pupils are equal, round, and reactive to light.  Neck: Neck supple.  Cardiovascular: Normal rate, regular rhythm and normal heart sounds.   No murmur heard. Pulmonary/Chest: Effort normal and breath sounds normal. No respiratory distress. He has no wheezes.  Musculoskeletal: Normal range of motion.  Left foot: slight discomfort diffusely over dorsum of foot, diffusely tender across left foot, no erythema, minimal soft tissue swelling Right foot: marked diffuse edema from ankle inferiorly with erythema diffusely on the dorsum and midfoot, diffusely tender across right foot; interdigital areas without wound, plantar surface without wound  Neurological: He is alert and oriented to person, place, and time.  Skin: Skin is warm and dry.  Psychiatric: He has a normal mood and affect. His behavior is  normal.  Nursing note and vitals reviewed.   Vitals:   08/05/16 1151  BP: 132/80  Pulse: (!) 112  Resp: 17  Temp: 99.2 F (37.3 C)  TempSrc: Oral  SpO2: 96%  Weight: 277 lb (125.6 kg)  Height: 5' 11.5" (1.816 m)   Results for orders placed or performed in visit on 08/05/16  POCT CBC  Result Value Ref Range   WBC 13.3 (A) 4.6 - 10.2 K/uL   Lymph, poc 1.8 0.6 - 3.4   POC LYMPH PERCENT 13.8 10 - 50 %L   MID (cbc) 0.6 0 - 0.9   POC MID % 4.3 0 - 12 %M   POC Granulocyte 10.9 (A) 2 - 6.9   Granulocyte percent 81.9 (A) 37 - 80 %G   RBC 5.66 4.69 - 6.13 M/uL   Hemoglobin 18.0 14.1 - 18.1 g/dL   HCT, POC 29.549.9 62.143.5 - 53.7 %   MCV 88.2 80 - 97 fL   MCH, POC 31.9 (A) 27 - 31.2 pg   MCHC 36.2 (A) 31.8 - 35.4 g/dL   RDW, POC 30.813.3 %   Platelet Count, POC 250 142 - 424 K/uL   MPV 7.1 0 - 99.8 fL   Dg Foot Complete Left  Result Date: 08/05/2016 CLINICAL DATA:  Right greater than left foot pain with swelling. Gout versus cellulitis. Chronic ankle pain. Rule out fracture. EXAM: LEFT FOOT - COMPLETE 3+ VIEW COMPARISON:  None. FINDINGS: Dorsal soft tissue swelling identified. Calcification in the soft tissues between the base of the first and second metatarsals is likely from previous trauma or degenerative. No acute fractures are seen. No bony erosions to suggest gout on this study. IMPRESSION: Soft tissue swelling.  No acute underlying bony abnormality. Electronically Signed   By: Gerome Samavid  Williams III M.D   On: 08/05/2016 13:44   Dg Foot Complete Right  Result Date: 08/05/2016 CLINICAL DATA:  Pain and swelling. EXAM: RIGHT FOOT COMPLETE - 3+ VIEW COMPARISON:  June 22nd 2017 FINDINGS: The patient is status post joint replacement at the first MTP joint. Lucency around the hardware suggests loosening, unchanged since the comparison study. There is also erosive change in the distal second phalanx, also unchanged. Diffuse soft tissue  swelling. No fractures or other bony abnormalities identified.  IMPRESSION: 1. Diffuse soft tissue swelling.  No acute fracture identified. 2. First MTP joint replacement. Lucency around the hardware consistent with loosening. 3. Erosive changes of the distal second phalanx, unchanged. Electronically Signed   By: Gerome Sam III M.D   On: 08/05/2016 13:48       Assessment & Plan:   TAYO MAUTE is a 60 y.o. male Acute pain of right foot - Plan: Uric Acid, DG Foot Complete Right, cefTRIAXone (ROCEPHIN) injection 1 g, HYDROcodone-acetaminophen (NORCO/VICODIN) 5-325 MG tablet Foot swelling - Plan: POCT CBC, Uric Acid, DG Foot Complete Left, DG Foot Complete Right, cefTRIAXone (ROCEPHIN) injection 1 g Elevated uric acid in blood - Plan: Uric Acid Pain in left foot - Plan: DG Foot Complete Left Leukocytosis, unspecified type - Plan: cefTRIAXone (ROCEPHIN) injection 1 g, doxycycline (VIBRA-TABS) 100 MG tablet  -History of chronic ankle pain, pedal edema, with now right acute foot swelling and pain greater than left foot swelling. Suspected gout without known injury, but with amount of swelling and redness of right foot, and mild leukocytosis, also concerning for cellulitis. Although leukocytosis may be related to gout flare. Does have previous orthosis of that foot with some possible loosening of hardware based on imaging.  - Rocephin 1 g given, doxycycline 100 mg twice a day. Check uric acid, hydrocodone if needed for breakthrough pain, and recheck in 48 hours.  -Plan to follow-up with orthopedics for chronic foot/ankle pain as well as visualized loosening of previous hardware. This appears to be stable from previous imaging.  CKD (chronic kidney disease) stage 3, GFR 30-59 ml/min - Plan: Basic metabolic panel  -Avoiding NSAIDs if gout. BMP pending.  Hypokalemia - Plan: Basic metabolic panel  -Previously controlled, repeat BMP to make sure this is stable.   Meds ordered this encounter  Medications  . cefTRIAXone (ROCEPHIN) injection 1 g  .  doxycycline (VIBRA-TABS) 100 MG tablet    Sig: Take 1 tablet (100 mg total) by mouth 2 (two) times daily.    Dispense:  20 tablet    Refill:  0  . HYDROcodone-acetaminophen (NORCO/VICODIN) 5-325 MG tablet    Sig: Take 1 tablet by mouth every 6 (six) hours as needed for moderate pain.    Dispense:  15 tablet    Refill:  0   Patient Instructions   Infection fighting cells elevated, could be either infection or gout. Will treat for infection initially, with antibiotic shot today and doxycycline twice per day. Hydrocodone if needed for breakthrough pain. Recheck Monday with me.  We will have gout test results then. Return to the clinic or go to the nearest emergency room if any of your symptoms worsen or new symptoms occur.  For burning in feet - can discuss this at follow up.  May need to see neurologist, but can be discussed.   I will check kidney function as well as potassium.   Call your orthopaedist about chronic ankle pain.  There also appeared to be some loosening of hardware at toe that may need to be addressed.    IF you received an x-ray today, you will receive an invoice from [facility]. Please contact [facility] at [phone] with questions or concerns regarding your invoice.   IF you received labwork today, you will receive an invoice from [facility]. Please contact [facility] at [phone] with questions or concerns regarding your invoice.   Our billing staff will not be able to  assist you with questions regarding bills from these companies.  You will be contacted with the lab results as soon as they are available. The fastest way to get your results is to activate your My Chart account. Instructions are located on the last page of this paperwork. If you have not heard from [name] regarding the results in 2 weeks, please contact this office.        I personally performed the services described in this documentation, which was  scribed in my presence. The recorded information has been reviewed and considered, and addended by me as needed.   Signed,   Meredith Staggers, MD [facility].  08/06/16 5:33 PM

## 2016-08-05 NOTE — Patient Instructions (Addendum)
Infection fighting cells elevated, could be either infection or gout. Will treat for infection initially, with antibiotic shot today and doxycycline twice per day. Hydrocodone if needed for breakthrough pain. Recheck Monday with me.  We will have gout test results then. Return to the clinic or go to the nearest emergency room if any of your symptoms worsen or new symptoms occur.  For burning in feet - can discuss this at follow up.  May need to see neurologist, but can be discussed.   I will check kidney function as well as potassium.   Call your orthopaedist about chronic ankle pain.  There also appeared to be some loosening of hardware at toe that may need to be addressed.    IF you received an x-ray today, you will receive an invoice from [facility]. Please contact [facility] at [phone] with questions or concerns regarding your invoice.   IF you received labwork today, you will receive an invoice from [facility]. Please contact [facility] at [phone] with questions or concerns regarding your invoice.   Our billing staff will not be able to assist you with questions regarding bills from these companies.  You will be contacted with the lab results as soon as they are available. The fastest way to get your results is to activate your My Chart account. Instructions are located on the last page of this paperwork. If you have not heard from us regarding the results in 2 weeks, please contact this office.

## 2016-08-06 LAB — URIC ACID: URIC ACID: 7 mg/dL (ref 3.7–8.6)

## 2016-08-06 LAB — BASIC METABOLIC PANEL
BUN / CREAT RATIO: 15 (ref 9–20)
BUN: 19 mg/dL (ref 6–24)
CHLORIDE: 97 mmol/L (ref 96–106)
CO2: 27 mmol/L (ref 18–29)
Calcium: 9.8 mg/dL (ref 8.7–10.2)
Creatinine, Ser: 1.26 mg/dL (ref 0.76–1.27)
GFR calc non Af Amer: 62 mL/min/{1.73_m2} (ref >59)
GFR, EST AFRICAN AMERICAN: 72 mL/min/{1.73_m2} (ref >59)
GLUCOSE: 85 mg/dL (ref 65–99)
Potassium: 3.8 mmol/L (ref 3.5–5.2)
SODIUM: 143 mmol/L (ref 134–144)

## 2016-08-07 ENCOUNTER — Ambulatory Visit (INDEPENDENT_AMBULATORY_CARE_PROVIDER_SITE_OTHER): Payer: Medicare Other | Admitting: Family Medicine

## 2016-08-07 VITALS — BP 136/80 | HR 103 | Temp 99.5°F | Resp 16 | Ht 71.5 in | Wt 273.0 lb

## 2016-08-07 DIAGNOSIS — I5032 Chronic diastolic (congestive) heart failure: Secondary | ICD-10-CM | POA: Diagnosis not present

## 2016-08-07 DIAGNOSIS — L03119 Cellulitis of unspecified part of limb: Secondary | ICD-10-CM | POA: Diagnosis not present

## 2016-08-07 DIAGNOSIS — Z9660 Presence of unspecified orthopedic joint implant: Secondary | ICD-10-CM | POA: Diagnosis not present

## 2016-08-07 DIAGNOSIS — Z23 Encounter for immunization: Secondary | ICD-10-CM | POA: Diagnosis not present

## 2016-08-07 DIAGNOSIS — M25474 Effusion, right foot: Secondary | ICD-10-CM | POA: Diagnosis not present

## 2016-08-07 LAB — POCT CBC
Granulocyte percent: 85.8 %G — AB (ref 37–80)
HEMATOCRIT: 51.6 % (ref 43.5–53.7)
Hemoglobin: 18.3 g/dL — AB (ref 14.1–18.1)
LYMPH, POC: 1.2 (ref 0.6–3.4)
MCH, POC: 31.7 pg — AB (ref 27–31.2)
MCHC: 35.4 g/dL (ref 31.8–35.4)
MCV: 89.7 fL (ref 80–97)
MID (cbc): 0.3 (ref 0–0.9)
MPV: 6.7 fL (ref 0–99.8)
PLATELET COUNT, POC: 264 10*3/uL (ref 142–424)
POC Granulocyte: 9.1 — AB (ref 2–6.9)
POC LYMPH %: 11.7 % (ref 10–50)
POC MID %: 2.5 %M (ref 0–12)
RBC: 5.76 M/uL (ref 4.69–6.13)
RDW, POC: 13.1 %
WBC: 10.6 10*3/uL — AB (ref 4.6–10.2)

## 2016-08-07 NOTE — Progress Notes (Signed)
Subjective:  By signing my name below, I, Peter Briggs, attest that this documentation has been prepared under the direction and in the presence of Peter StaggersJeffrey Rosslyn Pasion, MD.  Electronically Signed: Andrew Auaven Briggs, ED Scribe. 08/07/2016. 11:32 AM.   Patient ID: Peter Briggs, male    DOB: 26-Apr-1956, 60 y.o.   MRN: [id number]  HPI Chief Complaint  Patient presents with  . Follow-up    right foot pain is better but swelling is still the same    HPI Comments: Peter NipChristopher C Briggs is a 60 y.o. male who presents to the [facility] for follow up of right foot pain.  See last OV on Saturday. He was treated for possible cellulitis vs gout. Initial CBC had mild leukocytosis with slightly elevated WBC at 13.3. Renal function normal. Uric acid borderline elevated at 7.0. He was treated with rocephin 1 g and doxycycline 100mg  bid.   Since last visit, pt has had improvement with pain but unchanged swelling. He continues to limp.  He takes 4 hydrocodone a day. He denies fever and chills. Pt has joint replacement at [facility] several years ago. Some hard ware loosening based on past imaging.   Patient Active Problem List   Diagnosis Date Noted  . Subluxation of ankle, acquired 12/09/2015  . COPD (chronic obstructive pulmonary disease) (HCC) 01/28/2015  . Chronic diastolic heart failure (HCC) 01/08/2015  . OSA on CPAP 12/28/2014  . Pedal edema 12/28/2014  . Symptomatic bradycardia 06/10/2012  . Anxiety and depression 06/10/2012  . Insomnia 06/10/2012  . Hypertension 06/10/2012  . Obesity 06/10/2012  . Chronic kidney disease, stage III (moderate) 06/10/2012   Past Medical History:  Diagnosis Date  . Anxiety   . Arthritis    need to minimize/avoid NSAIDs due to CRI  . Chronic diastolic heart failure (HCC) 01/08/2015  . Chronic renal insufficiency, stage II (mild)    Baseline Cr 1.5-1.7--nearly normal for pt's body habitus; saw nephr in [place] 06/2014, renal u/s c/w  medicorenal dz  . Chronic tension headaches   . COPD (chronic obstructive pulmonary disease) (HCC)    [place] in [place] ([name])  . Depression 05/2012   H[facility] of [facility]  . Erectile dysfunction   . History of adenomatous polyp of colon   . History of alcohol abuse    Remote past-binge drinker  . History of restless legs syndrome   . Hyperlipidemia   . Hypertension   . Insomnia   . OSA on CPAP    17 cm H20 (Severe OSA with hypoxia down to 67% on sleep study 09/05/13 done by [facility], [name])  . Polycythemia    Past Surgical History:  Procedure Laterality Date  . CARDIOVASCULAR STRESS TEST  02/03/15   Nuclear stress test: NORMAL (EF 55-65%).  . COLONOSCOPY W/ POLYPECTOMY  2011 approx   recall 5 yrs  . FINGER SURGERY    . FRACTURE SURGERY    . JOINT REPLACEMENT    . ROTATOR CUFF REPAIR Right 2011  . TOE SURGERY Right    [name]; great toe MCP joint replacment  . TONSILLECTOMY/ADENOIDECTOMY/TURBINATE REDUCTION     did not help OSA  . TRANSTHORACIC ECHOCARDIOGRAM  12/2014   Normal LV, normal wall motion, EF 55-60%, grade 2 diastolic dysfxn  . UVULECTOMY     did not help OSA   No Known Allergies Prior to Admission medications  Medication Sig Start Date End Date Taking? Authorizing Provider  amitriptyline (ELAVIL) 100 MG tablet Take 1 tablet (100 mg total) by mouth at bedtime. 12/23/15  Yes [name], MD  aspirin EC 81 MG tablet Take 1 tablet (81 mg total) by mouth daily. 10/21/14  Yes [name], MD  doxycycline (VIBRA-TABS) 100 MG tablet Take 1 tablet (100 mg total) by mouth 2 (two) times daily. 08/05/16  Yes [name], MD  furosemide (LASIX) 40 MG tablet 1 tab po qd 12/23/15  Yes [name], MD  HYDROcodone-acetaminophen (NORCO/VICODIN) 5-325 MG tablet Take 1 tablet by mouth every 6 (six) hours as needed for moderate pain. 08/05/16  Yes [name], MD  Multiple Vitamins-Minerals (MULTIVITAMIN WITH MINERALS) tablet Take 1 tablet by mouth daily.   Yes Historical Provider, MD  imiquimod (ALDARA) 5 % cream Apply topically 3 (three) times a week. Max 16 weeks. Apply until total clearance of lesions. Patient not taking: Reported on 08/07/2016 12/23/15   [name], MD   Social History   Social History  . Marital status: Divorced    Spouse name: N/A  . Number of children: N/A  . Years of education: N/A   Occupational History  . Not on file.   Social History Main Topics  . Smoking status: Former Smoker    Types: Cigars  . Smokeless tobacco: Never Used  . Alcohol use No  . Drug use: No  . Sexual activity: Not Currently   Other Topics Concern  . Not on file   Social History Narrative   Divorced, 8 children, 14 grandchildren.   Orig from [place] area.     Relocated to [place] x 2 yrs, returned to [facility] 07/2014.   Occupation: disability (multifactorial medical problems) since 2014.   Used to be Engineer, site (residential and commercial).   Tob: 20 pack-yr hx, quit 1995.     Hx of alcohol abuse in the past; no alc since 2013.  Hx of marijuana use; quit 2010.    No other drug use.   Review of Systems  Constitutional: Negative for chills and fever.  Musculoskeletal: Positive for gait problem.  Skin: Positive for color change.       Objective:   Physical Exam  Constitutional: He is oriented to person, place, and time. He appears well-developed and well-nourished. No distress.  HENT:  Head: Normocephalic and atraumatic.  Eyes: Conjunctivae and EOM are normal.  Neck: Neck supple.  Cardiovascular: Normal rate.   Pulmonary/Chest: Effort normal.  Musculoskeletal: Normal range of motion.  Tender along dorsum medial foot with diffuse swelling and erythema across dorsal foot. Scar intact, NVI distally. Edema along medial and lateral ankle without bony tenderness.   Neurological: He is alert and oriented to person,  place, and time.  Skin: Skin is warm and dry.  Psychiatric: He has a normal mood and affect. His behavior is normal.  Nursing note and vitals reviewed.   Vitals:   08/07/16 1020  BP: 136/80  Pulse: (!) 103  Resp: 16  Temp: 99.5 F (37.5 C)  TempSrc: Oral  SpO2: 98%  Weight: 273 lb (123.8 kg)  Height: 5' 11.5" (1.816 m)    Results for orders placed or performed in visit on 08/07/16  POCT CBC  Result Value Ref Range   WBC 10.6 (A) 4.6 - 10.2 K/uL   Lymph, poc 1.2 0.6 - 3.4   POC LYMPH PERCENT 11.7 10 - 50 %L   MID (  cbc) 0.3 0 - 0.9   POC MID % 2.5 0 - 12 %M   POC Granulocyte 9.1 (A) 2 - 6.9   Granulocyte percent 85.8 (A) 37 - 80 %G   RBC 5.76 4.69 - 6.13 M/uL   Hemoglobin 18.3 (A) 14.1 - 18.1 g/dL   HCT, POC 16.151.6 09.643.5 - 53.7 %   MCV 89.7 80 - 97 fL   MCH, POC 31.7 (A) 27 - 31.2 pg   MCHC 35.4 31.8 - 35.4 g/dL   RDW, POC 04.513.1 %   Platelet Count, POC 264 142 - 424 K/uL   MPV 6.7 0 - 99.8 fL   Assessment & Plan:  Peter NipChristopher C Sirmon is a 60 y.o. male Cellulitis of foot - Plan: POCT CBC, Ambulatory referral to Orthopedic Surgery Swelling of foot joint, right - Plan: POCT CBC, Ambulatory referral to Orthopedic Surgery History of joint replacement, unspecified joint - Plan: Ambulatory referral to Orthopedic Surgery  -Cellulitis versus inflammatory arthropathy/gout. Improving after start of Rocephin and doxycycline 2 days prior. Continued on doxycycline without new side effects, afebrile.  With previous joint replacement and loosening of hardware, as well as persistent swelling and cellulitis and differential, will refer to orthopedics for eval in next few days. Hydrocodone if needed for now. ER/RTC precautions if worse sooner.  Need for prophylactic vaccination and inoculation against influenza - Plan: Flu Vaccine QUAD 36+ mos PF IM (Fluarix & Fluzone Quad PF)  Chronic diastolic heart failure (HCC)  -Based on problem list and history of pedal edema. Avoiding NSAIDs due to  possible history of CHF.  No orders of the defined types were placed in this encounter.  Patient Instructions    Your infection fighting cells are improved, but still slightly elevated. Continue the hydrocodone if needed for pain, continue doxycycline to treat possible infection, and I will try to have you seen within the next 2 days for your foot pain and possible infection. Based on heart history, do not think we should start an anti-inflammatory at this time.   Return to the clinic or go to the nearest emergency room if any of your symptoms worsen or new symptoms occur.   IF you received an x-ray today, you will receive an invoice from [facility]. Please contact [facility] at [phone] with questions or concerns regarding your invoice.   IF you received labwork today, you will receive an invoice from [facility]. Please contact [facility] at [phone] with questions or concerns regarding your invoice.   Our billing staff will not be able to assist you with questions regarding bills from these companies.  You will be contacted with the lab results as soon as they are available. The fastest way to get your results is to activate your My Chart account. Instructions are located on the last page of this paperwork. If you have not heard from us regarding the results in 2 weeks, please contact this office.        I personally performed the services described in this documentation, which was scribed in my presence. The recorded information has been reviewed and considered, and addended by me as needed.   Signed,   Peter StaggersJeffrey Deontre Allsup, MD [facility].  08/09/16 10:06 AM

## 2016-08-07 NOTE — Patient Instructions (Addendum)
  Your infection fighting cells are improved, but still slightly elevated. Continue the hydrocodone if needed for pain, continue doxycycline to treat possible infection, and I will try to have you seen within the next 2 days for your foot pain and possible infection. Based on heart history, do not think we should start an anti-inflammatory at this time.   Return to the clinic or go to the nearest emergency room if any of your symptoms worsen or new symptoms occur.   IF you received an x-ray today, you will receive an invoice from [facility]. Please contact [facility] at [phone] with questions or concerns regarding your invoice.   IF you received labwork today, you will receive an invoice from [facility]. Please contact [facility] at [phone] with questions or concerns regarding your invoice.   Our billing staff will not be able to assist you with questions regarding bills from these companies.  You will be contacted with the lab results as soon as they are available. The fastest way to get your results is to activate your My Chart account. Instructions are located on the last page of this paperwork. If you have not heard from us regarding the results in 2 weeks, please contact this office.

## 2016-08-15 ENCOUNTER — Ambulatory Visit (INDEPENDENT_AMBULATORY_CARE_PROVIDER_SITE_OTHER): Payer: Medicare Other | Admitting: Orthopedic Surgery

## 2016-08-15 ENCOUNTER — Encounter (INDEPENDENT_AMBULATORY_CARE_PROVIDER_SITE_OTHER): Payer: Self-pay | Admitting: Orthopedic Surgery

## 2016-08-15 ENCOUNTER — Ambulatory Visit (INDEPENDENT_AMBULATORY_CARE_PROVIDER_SITE_OTHER): Payer: Medicare Other

## 2016-08-15 VITALS — Ht 71.5 in | Wt 273.0 lb

## 2016-08-15 DIAGNOSIS — Z720 Tobacco use: Secondary | ICD-10-CM

## 2016-08-15 DIAGNOSIS — M25571 Pain in right ankle and joints of right foot: Secondary | ICD-10-CM | POA: Diagnosis not present

## 2016-08-15 DIAGNOSIS — M76829 Posterior tibial tendinitis, unspecified leg: Secondary | ICD-10-CM

## 2016-08-15 DIAGNOSIS — M2140 Flat foot [pes planus] (acquired), unspecified foot: Secondary | ICD-10-CM

## 2016-08-15 NOTE — Progress Notes (Signed)
Office Visit Note   Patient: Peter Briggs           Date of Birth: [birth year]           MRN: [id number] Visit Date: 08/15/2016              Requested by: Shade FloodJeffrey R Greene, MD [address] [place], [place] [id number] PCP: Shade FloodGREENE,JEFFREY R, MD   Assessment & Plan: Visit Diagnoses:  1. Posterior tibialis tendon insufficiency   2. Pain in right ankle and joints of right foot     Plan: Patient has a fixed pronator valgus hindfoot with posterior tibial tendon chronic pain sinus Tarsi pain with impingement. Discussed treatment options. Patient has failed all conservative treatment including immobilization and custom orthotics. We will plan for a subtalar and talonavicular fusion. Patient will call us once he has stopped smoking so we could schedule surgery. Discussed that with smoking he is at increased risk of the skin not healing the fusion not healing potential for amputation. Patient states he understands and will call once he has completely stop smoking.  Follow-Up Instructions: Return if symptoms worsen or fail to improve.   Orders:  Orders Placed This Encounter  Procedures  . XR Ankle Complete Right   No orders of the defined types were placed in this encounter.     Procedures: No procedures performed   Clinical Data: No additional findings.   Subjective: Chief Complaint  Patient presents with  . Right Foot - Pain  . Left Foot - Pain  . Right Ankle - Pain    Patient being referred for possible cellulitis versus inflammatory arthropathy with gout. Improving after start of Rocephin and doxycycline. He has two doses left of antibiotic. History of previous joint replacement and loosening of hardware, as well as persistent swelling. Xrays taken of bilateral feet. He states he is here for evaluation of both feet today. Swelling has decreased after taking antibiotic. He feels like right ankle is shifting. Pain is severe at times where he believes weightbearing is next  to impossible. He has not had any diagnostic imaging done of right ankle since 2016, next xrays are ordered today.     Review of Systems   Objective: Vital Signs: Ht 5' 11.5" (1.816 m)   Wt 273 lb (123.8 kg)   BMI 37.55 kg/m   Physical Exam examination patient is alert oriented no adenopathy well-dressed normal affect normal respiratory effort he has an antalgic gait. Patient has a good dorsalis pedis pulse. He has a fixed pronator valgus hindfoot on the right. He cannot do a single limb heel raise and has no heel lift. Left foot he does have a little bit of single limb heel raise ability. Patient has pain to palpation along the posterior tibial tendon along the talonavicular joint as well as pain with impingement in the sinus Tarsi.  Ortho Exam  Specialty Comments:  No specialty comments available.  Imaging: Xr Ankle Complete Right  Result Date: 08/15/2016 Three-view radiographs of the right ankle show some mild arthritic changes with some osteophytic bone spurs and some mild joint space narrowing.    PMFS History: Patient Active Problem List   Diagnosis Date Noted  . Subluxation of ankle, acquired 12/09/2015  . COPD (chronic obstructive pulmonary disease) (HCC) 01/28/2015  . Chronic diastolic heart failure (HCC) 01/08/2015  . OSA on CPAP 12/28/2014  . Pedal edema 12/28/2014  . Symptomatic bradycardia 06/10/2012  . Anxiety and depression 06/10/2012  . Insomnia 06/10/2012  .  Hypertension 06/10/2012  . Obesity 06/10/2012  . Chronic kidney disease, stage III (moderate) 06/10/2012   Past Medical History:  Diagnosis Date  . Anxiety   . Arthritis    need to minimize/avoid NSAIDs due to CRI  . Chronic diastolic heart failure (HCC) 01/08/2015  . Chronic renal insufficiency, stage II (mild)    Baseline Cr 1.5-1.7--nearly normal for pt's body habitus; [place] in [place] 06/2014, renal u/s c/w medicorenal dz  . Chronic tension headaches   . COPD (chronic obstructive  pulmonary disease) (HCC)    [place] ([name])  . Depression 05/2012   [facility]  . Erectile dysfunction   . History of adenomatous polyp of colon   . History of alcohol abuse    Remote past-binge drinker  . History of restless legs syndrome   . Hyperlipidemia   . Hypertension   . Insomnia   . OSA on CPAP    17 cm H20 (Severe OSA with hypoxia down to 67% on sleep study 09/05/13 [facility], [name])  . Polycythemia     Family History  Problem Relation Age of Onset  . COPD Mother   . Asthma Daughter   . Sleep apnea Son   . Asthma Daughter   . Kidney disease Daughter   . Sleep apnea Son     Past Surgical History:  Procedure Laterality Date  . CARDIOVASCULAR STRESS TEST  02/03/15   Nuclear stress test: NORMAL (EF 55-65%).  . COLONOSCOPY W/ POLYPECTOMY  2011 approx   recall 5 yrs  . FINGER SURGERY    . FRACTURE SURGERY    . JOINT REPLACEMENT    . ROTATOR CUFF REPAIR Right 2011  . TOE SURGERY Right    [name]; great toe MCP joint replacment  . TONSILLECTOMY/ADENOIDECTOMY/TURBINATE REDUCTION     did not help OSA  . TRANSTHORACIC ECHOCARDIOGRAM  12/2014   Normal LV, normal wall motion, EF 55-60%, grade 2 diastolic dysfxn  . UVULECTOMY     did not help OSA   Social History   Occupational History  . Not on file.   Social History Main Topics  . Smoking status: Former Smoker    Types: Cigars  . Smokeless tobacco: Never Used  . Alcohol use No  . Drug use: No  . Sexual activity: Not Currently

## 2016-08-23 ENCOUNTER — Encounter: Payer: Self-pay | Admitting: *Deleted

## 2016-10-04 ENCOUNTER — Other Ambulatory Visit: Payer: Self-pay | Admitting: Family Medicine

## 2016-10-04 DIAGNOSIS — M25579 Pain in unspecified ankle and joints of unspecified foot: Secondary | ICD-10-CM

## 2016-10-09 ENCOUNTER — Other Ambulatory Visit: Payer: Self-pay

## 2016-10-09 DIAGNOSIS — M25579 Pain in unspecified ankle and joints of unspecified foot: Secondary | ICD-10-CM

## 2016-10-09 DIAGNOSIS — G8929 Other chronic pain: Secondary | ICD-10-CM

## 2016-10-09 NOTE — Telephone Encounter (Signed)
Pt needs his amitriptyline refilled  Please advise [phone]

## 2016-10-09 NOTE — Telephone Encounter (Signed)
07/2016 last ov refilled 10/05/16?

## 2016-10-10 MED ORDER — AMITRIPTYLINE HCL 100 MG PO TABS: 100.0000 mg | ORAL_TABLET | Freq: Every day | ORAL | 1 refills | Status: DC

## 2016-10-10 NOTE — Addendum Note (Signed)
Addended by: Clarene CritchleyKOLLER, KAREN M on: 10/10/2016 10:36 AM   Modules accepted: Orders

## 2016-10-10 NOTE — Telephone Encounter (Signed)
Refilled, but should follow up to discuss if further refills needed.

## 2016-10-10 NOTE — Addendum Note (Signed)
Addended by: Meredith StaggersGREENE, Ivry Pigue R on: 10/10/2016 02:57 PM   Modules accepted: Orders

## 2016-10-10 NOTE — Telephone Encounter (Signed)
This was NOT filled 10/05/16, my error Please refill if ok, last seen 07/2016

## 2016-10-11 NOTE — Telephone Encounter (Signed)
Pt advised.

## 2017-01-01 ENCOUNTER — Other Ambulatory Visit: Payer: Self-pay | Admitting: Family Medicine

## 2017-01-01 DIAGNOSIS — M25579 Pain in unspecified ankle and joints of unspecified foot: Secondary | ICD-10-CM

## 2017-01-01 DIAGNOSIS — G8929 Other chronic pain: Secondary | ICD-10-CM

## 2017-01-04 ENCOUNTER — Telehealth: Payer: Self-pay | Admitting: Emergency Medicine

## 2017-01-04 DIAGNOSIS — R600 Localized edema: Secondary | ICD-10-CM

## 2017-01-04 MED ORDER — FUROSEMIDE 40 MG PO TABS: ORAL_TABLET | ORAL | 0 refills | Status: AC

## 2017-01-08 ENCOUNTER — Ambulatory Visit (INDEPENDENT_AMBULATORY_CARE_PROVIDER_SITE_OTHER): Payer: Medicare Other

## 2017-01-08 ENCOUNTER — Encounter: Payer: Self-pay | Admitting: Family Medicine

## 2017-01-08 ENCOUNTER — Ambulatory Visit (INDEPENDENT_AMBULATORY_CARE_PROVIDER_SITE_OTHER): Payer: Medicare Other | Admitting: Family Medicine

## 2017-01-08 VITALS — BP 134/83 | HR 92 | Temp 98.1°F | Resp 16 | Ht 70.75 in | Wt 279.0 lb

## 2017-01-08 DIAGNOSIS — R208 Other disturbances of skin sensation: Secondary | ICD-10-CM | POA: Diagnosis not present

## 2017-01-08 DIAGNOSIS — M25475 Effusion, left foot: Secondary | ICD-10-CM

## 2017-01-08 DIAGNOSIS — N189 Chronic kidney disease, unspecified: Secondary | ICD-10-CM

## 2017-01-08 DIAGNOSIS — M79672 Pain in left foot: Secondary | ICD-10-CM

## 2017-01-08 DIAGNOSIS — M19072 Primary osteoarthritis, left ankle and foot: Secondary | ICD-10-CM | POA: Diagnosis not present

## 2017-01-08 DIAGNOSIS — M79671 Pain in right foot: Secondary | ICD-10-CM

## 2017-01-08 NOTE — Progress Notes (Signed)
By signing my name below, I, Mesha Guinyard, attest that this documentation has been prepared under the direction and in the presence of Meredith Staggers, MD.  Electronically Signed: Arvilla Market, Medical Scribe. 01/08/17. 10:59 AM.  Subjective:    Patient ID: Peter Briggs, male    DOB: 1956-07-03, 61 y.o.   MRN: [id number]  HPI Chief Complaint  Patient presents with  . Foot pain    both feet    HPI Comments: ALGER KERSTEIN is a 61 y.o. male who presents to Primary Care at [facility] complaining of bilateral foot pain. He has been seen for foot pain in the past including 07/2016, as well as 2016 after navicular fracture. He was treated 08/05/16 for right foot pain and pedal edema; possible gout vs cellulitis. Treated with rocephin 1 g and doxycycline 100 mg BID and advised to follow-up with ortho for chronic foot pain and evaluation of loosening hardware from previous surgery. He was seen in follow-up 2 days later; improving, still possible cellulitis vs gout, uric acid 7.0. Right and left foot x-ray Aug 05, 2017 right foot and diffuse soft tissue swelling and lucency around hardware 1st MTP, joint replacement and errosive changes of distal 2nd phalanx; left foot soft tissue swelling no acute bony abnormality  Reports chronic bilateral foot pain (no changes since last visit) and his feet are frequently cold with tingling. Pt plans on having surgery on right foot by Dr, Lajoyce Corners in the fall when he's less busy. Pt dropped a piece of plywood on his left 20 years ago, not followed by Dr. Lajoyce Corners. Pt is taking elavil 100 mg at night for his sxs. Pt wears flip-flops, oversized boots, and thick socks so he'll have room for his feet. Denies tingling in his hands.  Patient Active Problem List   Diagnosis Date Noted  . Subluxation of ankle, acquired 12/09/2015  . COPD (chronic obstructive pulmonary disease) (HCC) 01/28/2015  . Chronic diastolic heart failure (HCC) 01/08/2015  . OSA on CPAP  12/28/2014  . Pedal edema 12/28/2014  . Symptomatic bradycardia 06/10/2012  . Anxiety and depression 06/10/2012  . Insomnia 06/10/2012  . Hypertension 06/10/2012  . Obesity 06/10/2012  . Chronic kidney disease, stage III (moderate) 06/10/2012   Past Medical History:  Diagnosis Date  . Anxiety   . Arthritis    need to minimize/avoid NSAIDs due to CRI  . Chronic diastolic heart failure (HCC) 01/08/2015  . Chronic renal insufficiency, stage II (mild)    Baseline Cr 1.5-1.7--nearly normal for pt's body habitus; saw nephr in [place] 06/2014, renal u/s c/w medicorenal dz  . Chronic tension headaches   . COPD (chronic obstructive pulmonary disease) (HCC)    saw pulmonologist in [place], [place] (Dr. Earley Favor)  . Depression 05/2012   Hx of inpatient stay at [facility] for suicide attempt  . Erectile dysfunction   . History of adenomatous polyp of colon   . History of alcohol abuse    Remote past-binge drinker  . History of restless legs syndrome   . Hyperlipidemia   . Hypertension   . Insomnia   . OSA on CPAP    17 cm H20 (Severe OSA with hypoxia down to 67% on sleep study 09/05/13 done by [facility] Sleep medicine, Dr. Marene Lenz)  . Polycythemia    Past Surgical History:  Procedure Laterality Date  . CARDIOVASCULAR STRESS TEST  02/03/15   Nuclear stress test: NORMAL (EF 55-65%).  . COLONOSCOPY W/ POLYPECTOMY  2011 approx  recall 5 yrs  . FINGER SURGERY    . FRACTURE SURGERY    . JOINT REPLACEMENT    . ROTATOR CUFF REPAIR Right 2011  . TOE SURGERY Right    [name]; great toe MCP joint replacment  . TONSILLECTOMY/ADENOIDECTOMY/TURBINATE REDUCTION     did not help OSA  . TRANSTHORACIC ECHOCARDIOGRAM  12/2014   Normal LV, normal wall motion, EF 55-60%, grade 2 diastolic dysfxn  . UVULECTOMY     did not help OSA   No Known Allergies Prior to Admission medications   Medication Sig Start Date End Date Taking? Authorizing Provider  amitriptyline  (ELAVIL) 100 MG tablet TAKE 1 TABLET BY MOUTH AT BEDTIME 01/04/17  Yes Shade FloodGreene, Chrishawn Kring R, MD  aspirin EC 81 MG tablet Take 1 tablet (81 mg total) by mouth daily. 10/21/14  Yes [name], Maryjean MornPhilip H, MD  furosemide (LASIX) 40 MG tablet 1 tab po qd 01/04/17  Yes Shade FloodGreene, Amere Iott R, MD  Multiple Vitamins-Minerals (MULTIVITAMIN WITH MINERALS) tablet Take 1 tablet by mouth daily.   Yes [provider]  HYDROcodone-acetaminophen (NORCO/VICODIN) 5-325 MG tablet Take 1 tablet by mouth every 6 (six) hours as needed for moderate pain. Patient not taking: Reported on 01/08/2017 08/05/16   Shade FloodGreene, Dimond Crotty R, MD  imiquimod Mathis Dad(ALDARA) 5 % cream Apply topically 3 (three) times a week. Max 16 weeks. Apply until total clearance of lesions. Patient not taking: Reported on 08/15/2016 12/23/15   Shade FloodGreene, Ashlon Lottman R, MD   Social History   Social History  . Marital status: Divorced    Spouse name: N/A  . Number of children: N/A  . Years of education: N/A   Occupational History  . Not on file.   Social History Main Topics  . Smoking status: Former Smoker    Types: Cigars  . Smokeless tobacco: Never Used  . Alcohol use No  . Drug use: No  . Sexual activity: Not Currently   Other Topics Concern  . Not on file   Social History Narrative   Divorced, 8 children, 14 grandchildren.   Orig [place] area.     [place]   Occupation: disability (multifactorial medical problems) since 2014.   Used to be Engineer, siteHVAC technician (residential and commercial).   Tob: 20 pack-yr hx, quit 1995.     Hx of alcohol abuse in the past; no alc since 2013.  Hx of marijuana use; quit 2010.    No other drug use.   Review of Systems  Endocrine: Positive for cold intolerance (in feet).  Musculoskeletal: Positive for joint swelling.  Neurological: Negative for numbness.   Objective:  Physical Exam  Constitutional: He appears well-developed and well-nourished. No distress.  HENT:    Head: Normocephalic and atraumatic.  Eyes: Conjunctivae are normal.  Neck: Neck supple.  Cardiovascular: Normal rate.   Pulses:      Dorsalis pedis pulses are 2+ on the right side, and 2+ on the left side.  Pulmonary/Chest: Effort normal.  Musculoskeletal:  Left ankle no diffuse tenderness lower extremity edema Ankle non tender Cap refill <1 second at his toes Soft tissue swelling along the mid dorsal left foot, minimally tender in affected area Remainder of foot is non tender and no focal bony tenderness Right foot soft tissue along mid right foot without appreciable tenderness Minimal warmth along medial aspect but redness Cap refill <1 second  Neurological: He is alert.  Skin: Skin is warm and dry.  Psychiatric:  He has a normal mood and affect. His behavior is normal.  Nursing note and vitals reviewed.   Vitals:   01/08/17 1016  BP: 134/83  Pulse: 92  Resp: 16  Temp: 98.1 F (36.7 C)  TempSrc: Oral  SpO2: 95%  Weight: 279 lb (126.6 kg)  Height: 5' 10.75" (1.797 m)  Body mass index is 39.19 kg/m.   Dg Foot Complete Left  Result Date: 01/08/2017 CLINICAL DATA:  Chronic soft tissue swelling in the midfoot region EXAM: LEFT FOOT - COMPLETE 3+ VIEW COMPARISON:  August 05, 2016 FINDINGS: Frontal, oblique, and lateral views were obtained. There is soft tissue swelling in the dorsal midfoot region. No acute fracture or dislocation. There is focal osteoarthritic change in the middle cuneiform - second proximal metatarsal region, stable. To a lesser degree, there is osteoarthritic change in the lateral cuneiform -third proximal metatarsal region, stable. There is spurring along the dorsum of the midfoot in the area of swelling, stable. There is an exostosis, stable and benign appearing, arising from the lateral aspect of the proximal first metatarsal measuring 1.2 x 0.6 cm. There are foci of vascular calcification in the medial ankle region. IMPRESSION: Midfoot osteoarthritic  change, stable from prior study. Soft tissue swelling is noted in the dorsal mid midportion of the foot, slightly increased from prior study. Benign exostosis arising from the lateral proximal first metatarsal. No fracture or dislocation. No bony destruction. Foci of vascular calcification noted. Electronically Signed   By: Bretta Bang III M.D.   On: 01/08/2017 11:27   Assessment & Plan:  RACHAEL ZAPANTA is a 61 y.o. male Bilateral foot pain - Plan: Uric Acid, Care order/instruction:  Swelling of foot joint, left - Plan: Basic metabolic panel, TSH, DG Foot Complete Left, Uric Acid  Dysesthesia - Plan: Basic metabolic panel, TSH  Chronic kidney disease, unspecified CKD stage - Plan: Basic metabolic panel  Chronic R foot pain - continue follow up with ortho/foot specialist.   L midfoot swelling, dysesthesias bilateral feet. Midfoot OA changes with associated soft tissue swelling.  - check tsh, BMP for dysesthesias, but likely related to swelling  - check uric acid, but less likely gout.   - tylenol prn and follow up with ortho if persistent or sooner if worsening.   No orders of the defined types were placed in this encounter.  Patient Instructions    I will check thyroid and electrolyte tests for the tingling in the feet, but that may be related to the swelling. I tried to order vitamin B12 level, but it appears that has been checked within the past year at some point. I will check a gout tests, but this is unlikely gout. Tylenol is okay if needed for foot pain, avoid NSAIDs like Advil or Aleve as those can affect your heart and kidneys. Keep follow-up with your orthopedist/foot specialist, and if continued issues with your left foot, would make a separate appointment with orthopedics to evaluate that area further.  Follow-up with dermatology if needed for any concerning moles, but I am happy to see you back if needed for the lesion on your hand.   IF you received an x-ray  today, you will receive an invoice from [facility]. Please contact [facility] at [phone] with questions or concerns regarding your invoice.   IF you received labwork today, you will receive an invoice from [facility]. Please contact [facility] at [phone] with questions or concerns regarding your invoice.   Our billing staff will not be  able to assist you with questions regarding bills from these companies.  You will be contacted with the lab results as soon as they are available. The fastest way to get your results is to activate your My Chart account. Instructions are located on the last page of this paperwork. If you have not heard from [name] regarding the results in 2 weeks, please contact this office.      I personally performed the services described in this documentation, which was scribed in my presence. The recorded information has been reviewed and considered for accuracy and completeness, addended by me as needed, and agree with information above.  Signed,   Meredith Staggers, MD Primary Care at [facility].  01/08/17 10:40 PM

## 2017-01-08 NOTE — Patient Instructions (Addendum)
  I will check thyroid and electrolyte tests for the tingling in the feet, but that may be related to the swelling. I tried to order vitamin B12 level, but it appears that has been checked within the past year at some point. I will check a gout tests, but this is unlikely gout. Tylenol is okay if needed for foot pain, avoid NSAIDs like Advil or Aleve as those can affect your heart and kidneys. Keep follow-up with your orthopedist/foot specialist, and if continued issues with your left foot, would make a separate appointment with orthopedics to evaluate that area further.  Follow-up with dermatology if needed for any concerning moles, but I am happy to see you back if needed for the lesion on your hand.   IF you received an x-ray today, you will receive an invoice from [facility]. Please contact [facility] at [phone] with questions or concerns regarding your invoice.   IF you received labwork today, you will receive an invoice from [facility]. Please contact [facility] at [phone] with questions or concerns regarding your invoice.   Our billing staff will not be able to assist you with questions regarding bills from these companies.  You will be contacted with the lab results as soon as they are available. The fastest way to get your results is to activate your My Chart account. Instructions are located on the last page of this paperwork. If you have not heard from us regarding the results in 2 weeks, please contact this office.

## 2017-01-09 LAB — URIC ACID: Uric Acid: 8.8 mg/dL — ABNORMAL HIGH (ref 3.7–8.6)

## 2017-01-09 LAB — BASIC METABOLIC PANEL
BUN/Creatinine Ratio: 15 (ref 9–20)
BUN: 20 mg/dL (ref 6–24)
CALCIUM: 10.2 mg/dL (ref 8.7–10.2)
CHLORIDE: 97 mmol/L (ref 96–106)
CO2: 26 mmol/L (ref 18–29)
Creatinine, Ser: 1.37 mg/dL — ABNORMAL HIGH (ref 0.76–1.27)
GFR calc non Af Amer: 56 mL/min/{1.73_m2} — ABNORMAL LOW (ref >59)
GFR, EST AFRICAN AMERICAN: 65 mL/min/{1.73_m2} (ref >59)
Glucose: 90 mg/dL (ref 65–99)
POTASSIUM: 3.7 mmol/L (ref 3.5–5.2)
SODIUM: 143 mmol/L (ref 134–144)

## 2017-01-09 LAB — TSH: TSH: 2.28 u[IU]/mL (ref 0.450–4.500)

## 2017-06-28 DEATH — deceased

## 2017-11-12 IMAGING — DX DG FOOT COMPLETE 3+V*L*
3 series · 3 of 3 positions shown · non-contrast
Comparison: None.

CLINICAL DATA: Right greater than left foot pain with swelling.
Gout versus cellulitis. Chronic ankle pain. Rule out fracture.

EXAM:
LEFT FOOT - COMPLETE 3+ VIEW

[foot ap]
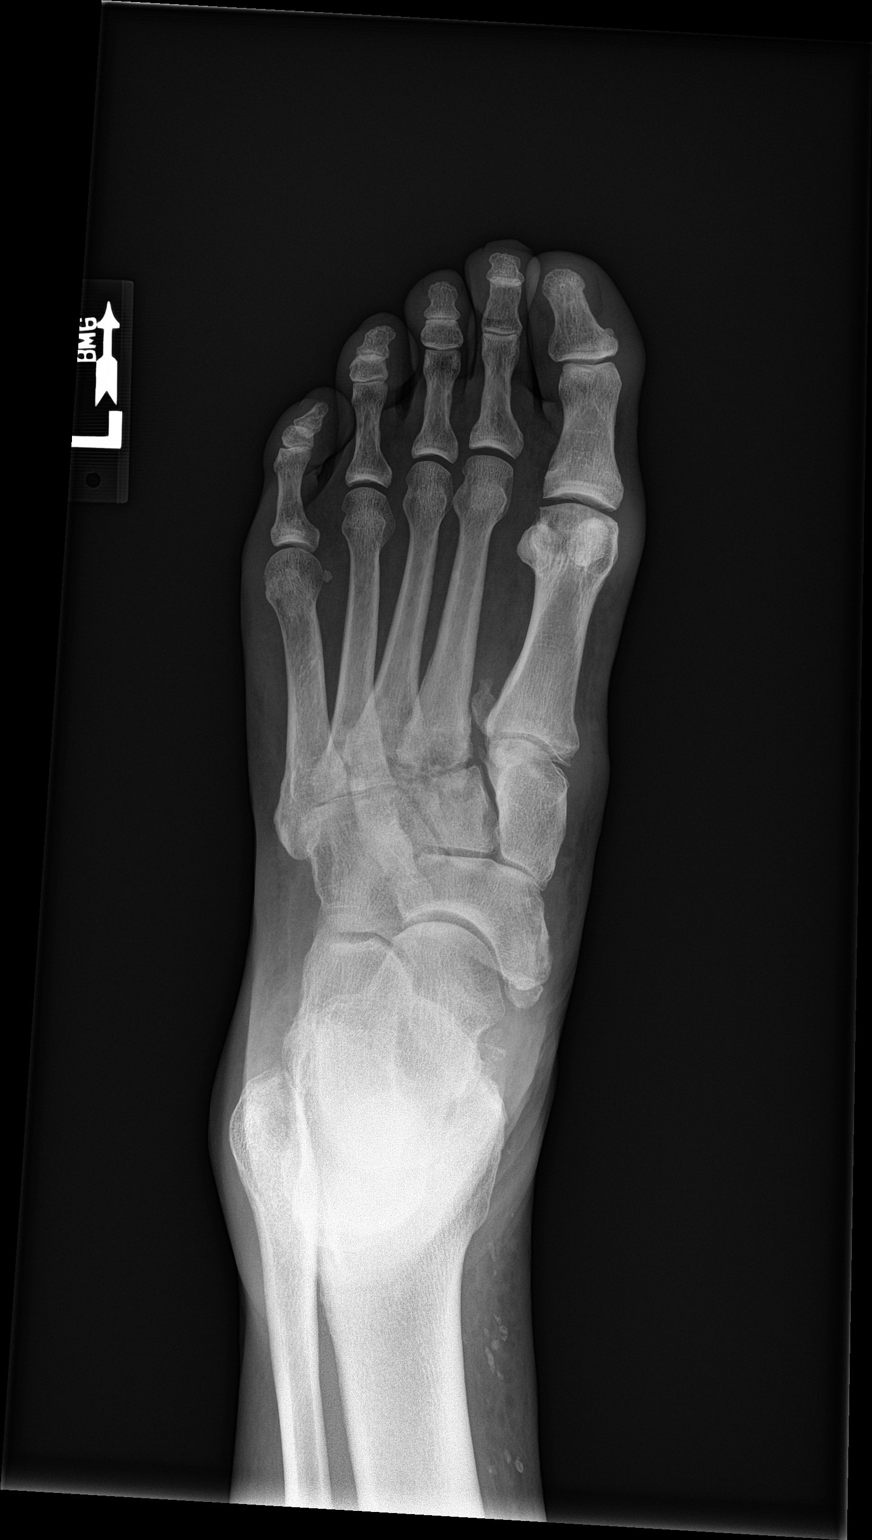

[foot obl]
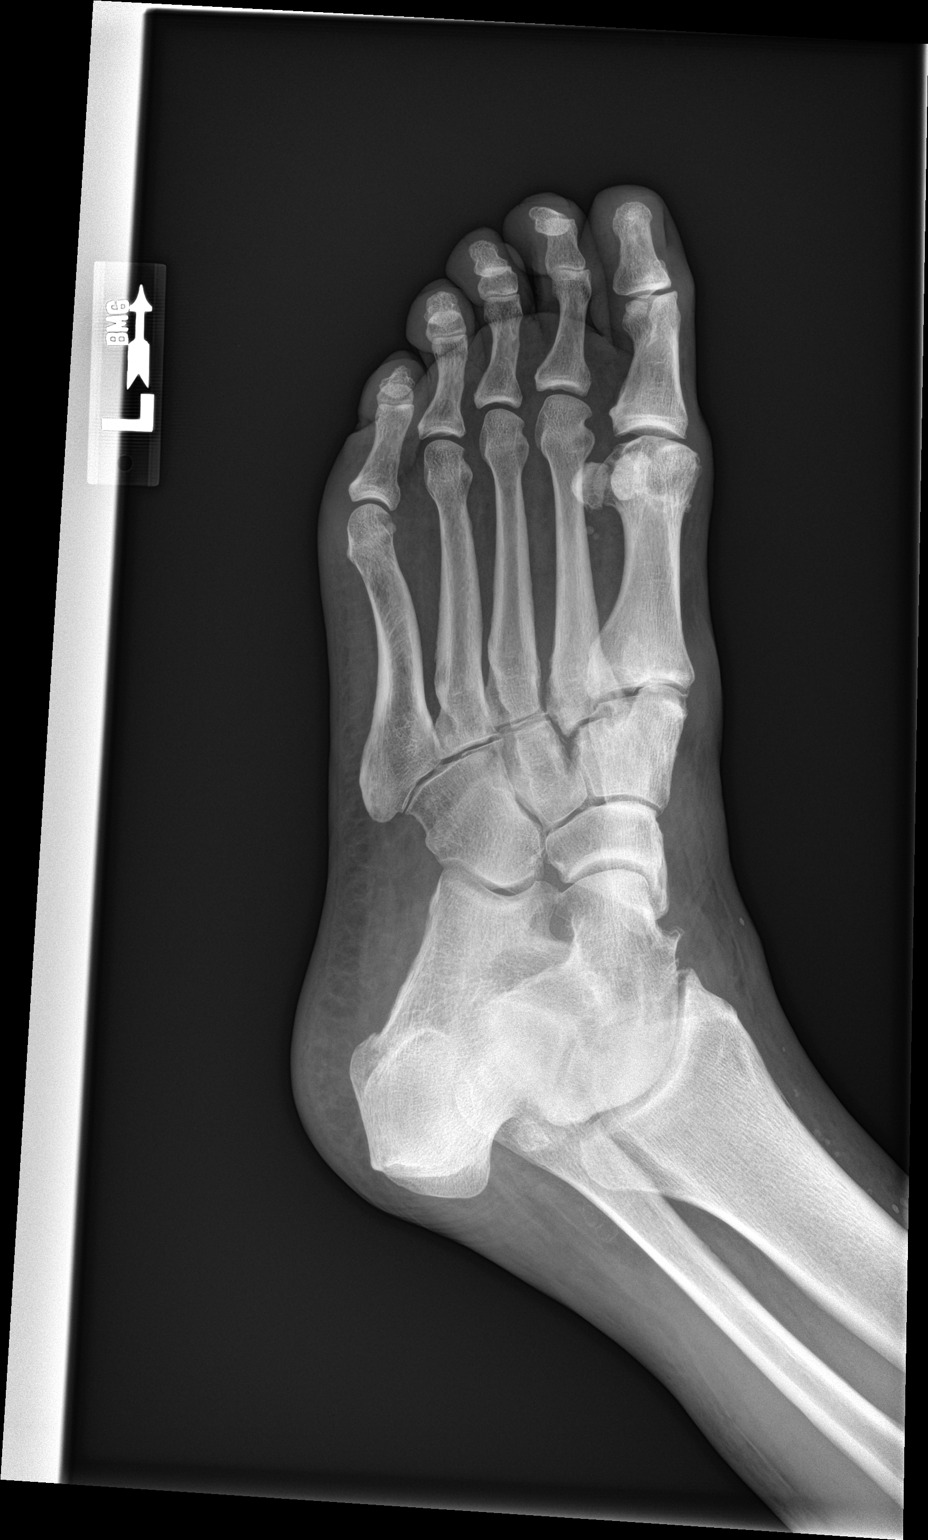

[foot lat]
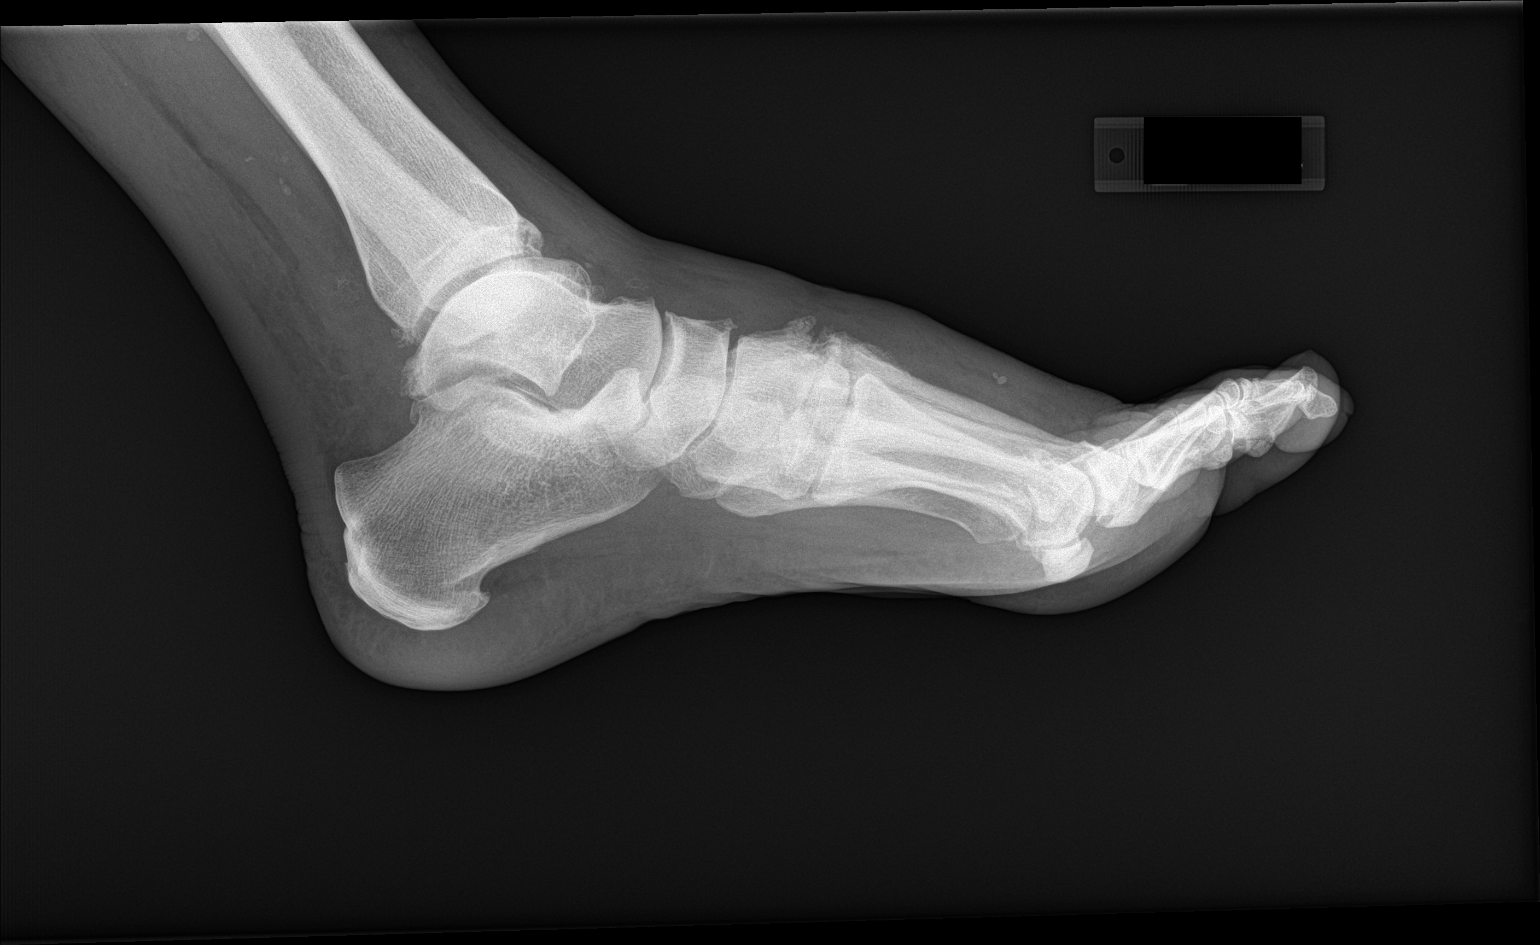

[3 of 3 positions shown; findings below may reference images not displayed]

FINDINGS: Dorsal soft tissue swelling identified. Calcification in the soft
tissues between the base of the first and second metatarsals is
likely from previous trauma or degenerative. No acute fractures are
seen. No bony erosions to suggest gout on this study.
IMPRESSION: Soft tissue swelling.  No acute underlying bony abnormality.

## 2017-11-12 IMAGING — DX DG FOOT COMPLETE 3+V*R*
3 series · 3 of 3 positions shown · non-contrast
Comparison: [REDACTED] 2507

CLINICAL DATA: Pain and swelling.

EXAM:
RIGHT FOOT COMPLETE - 3+ VIEW

[foot ap]
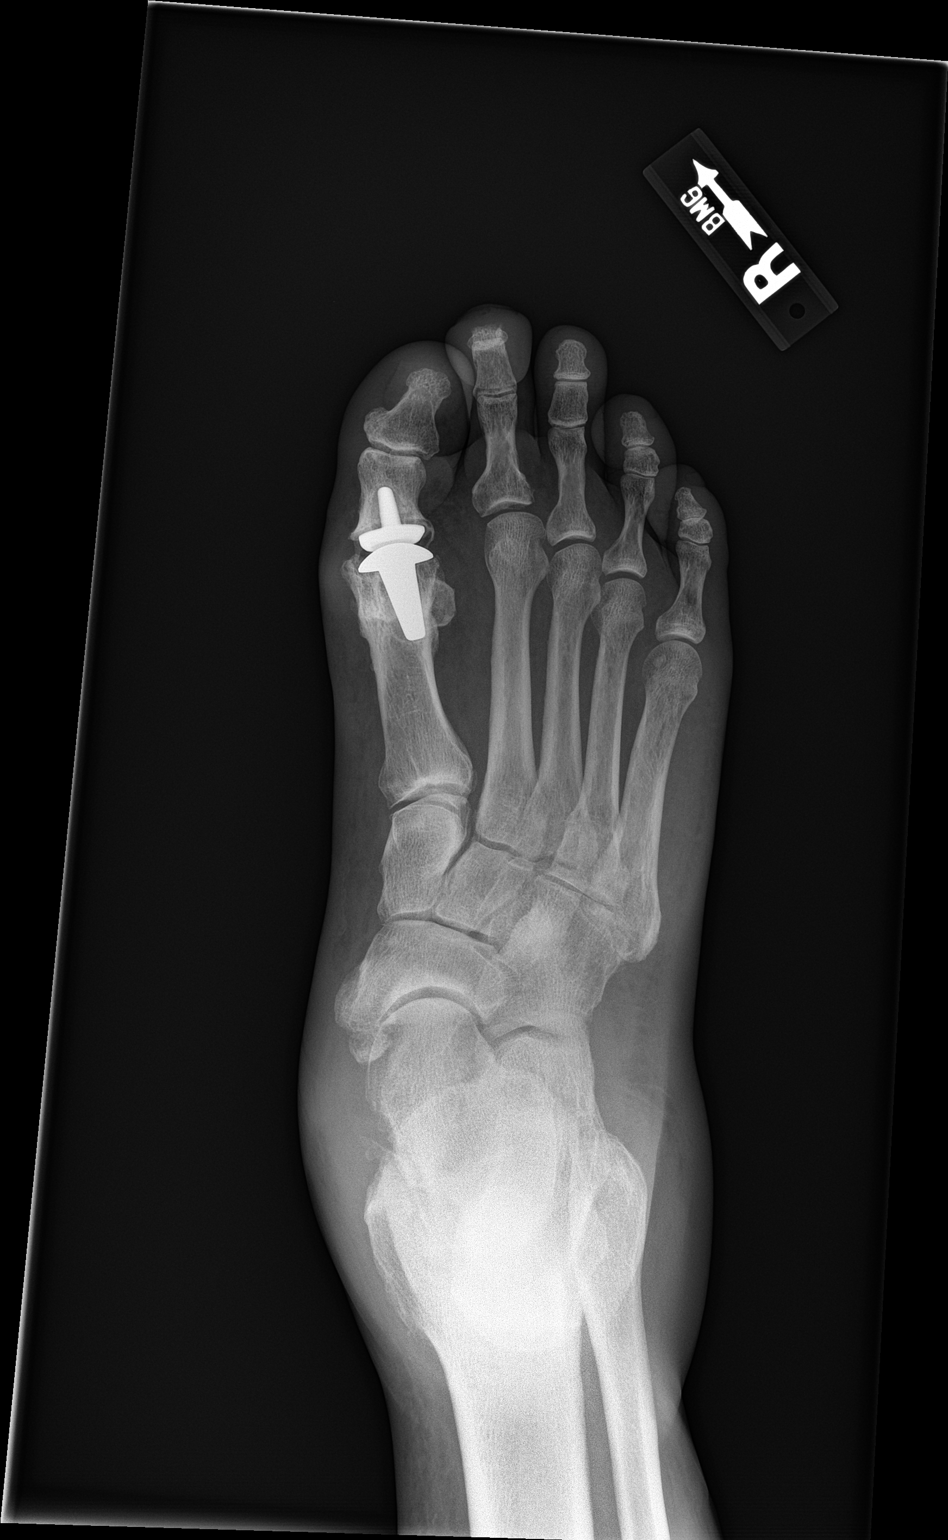

[foot obl]
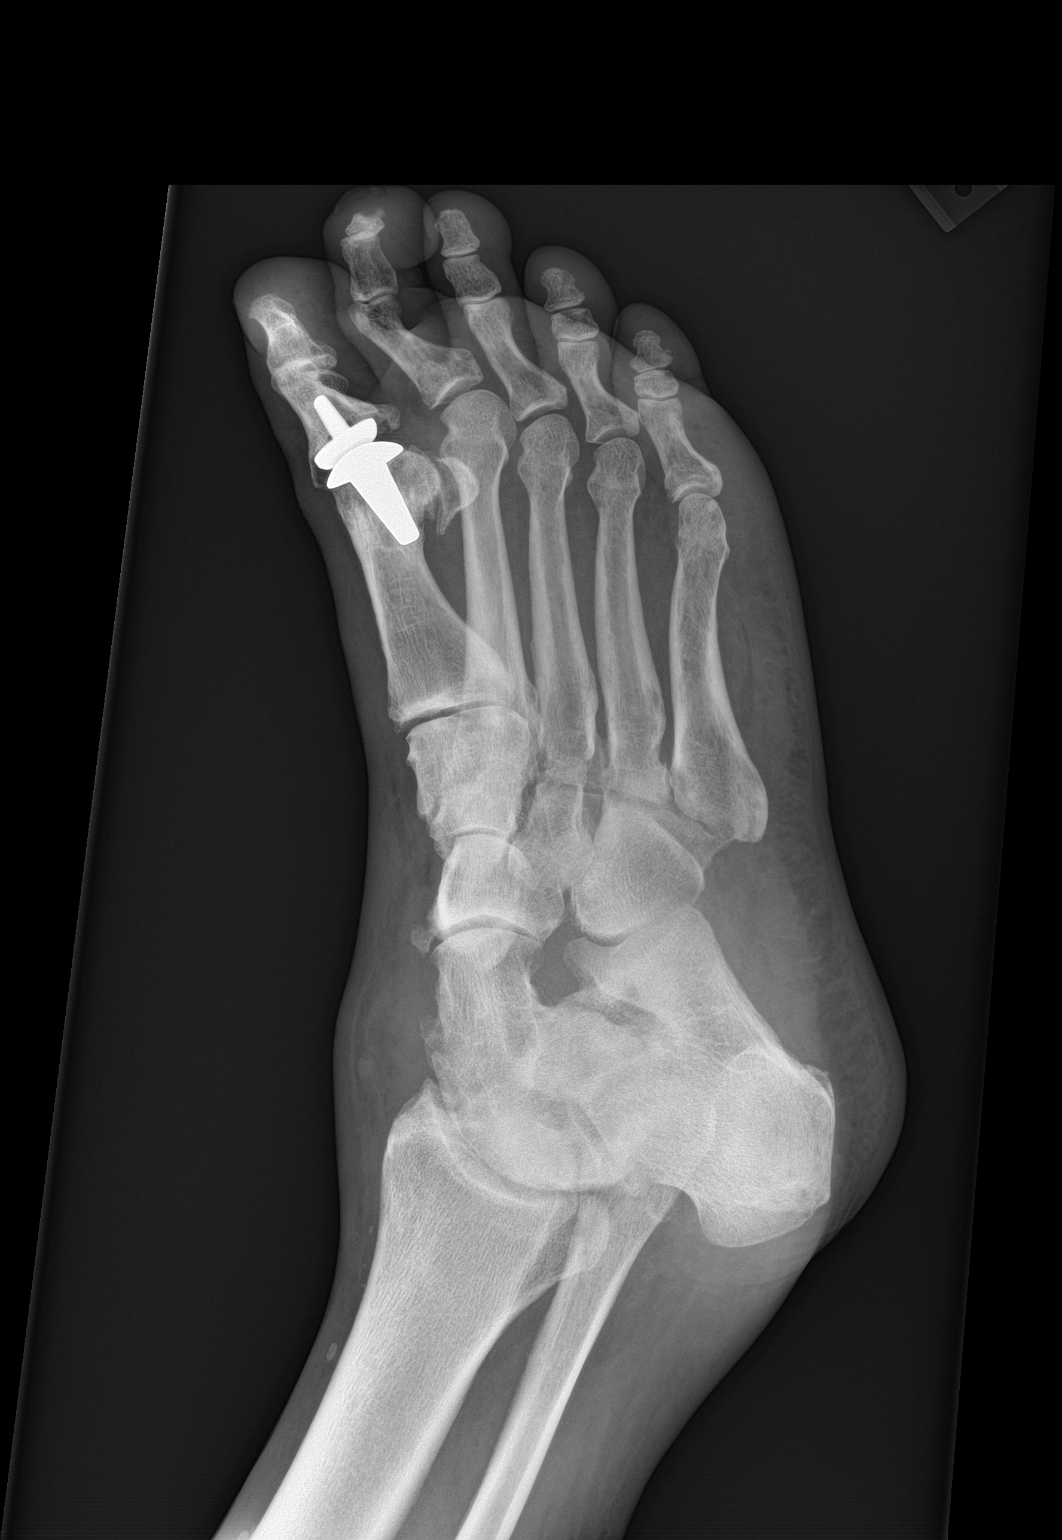

[foot lat]
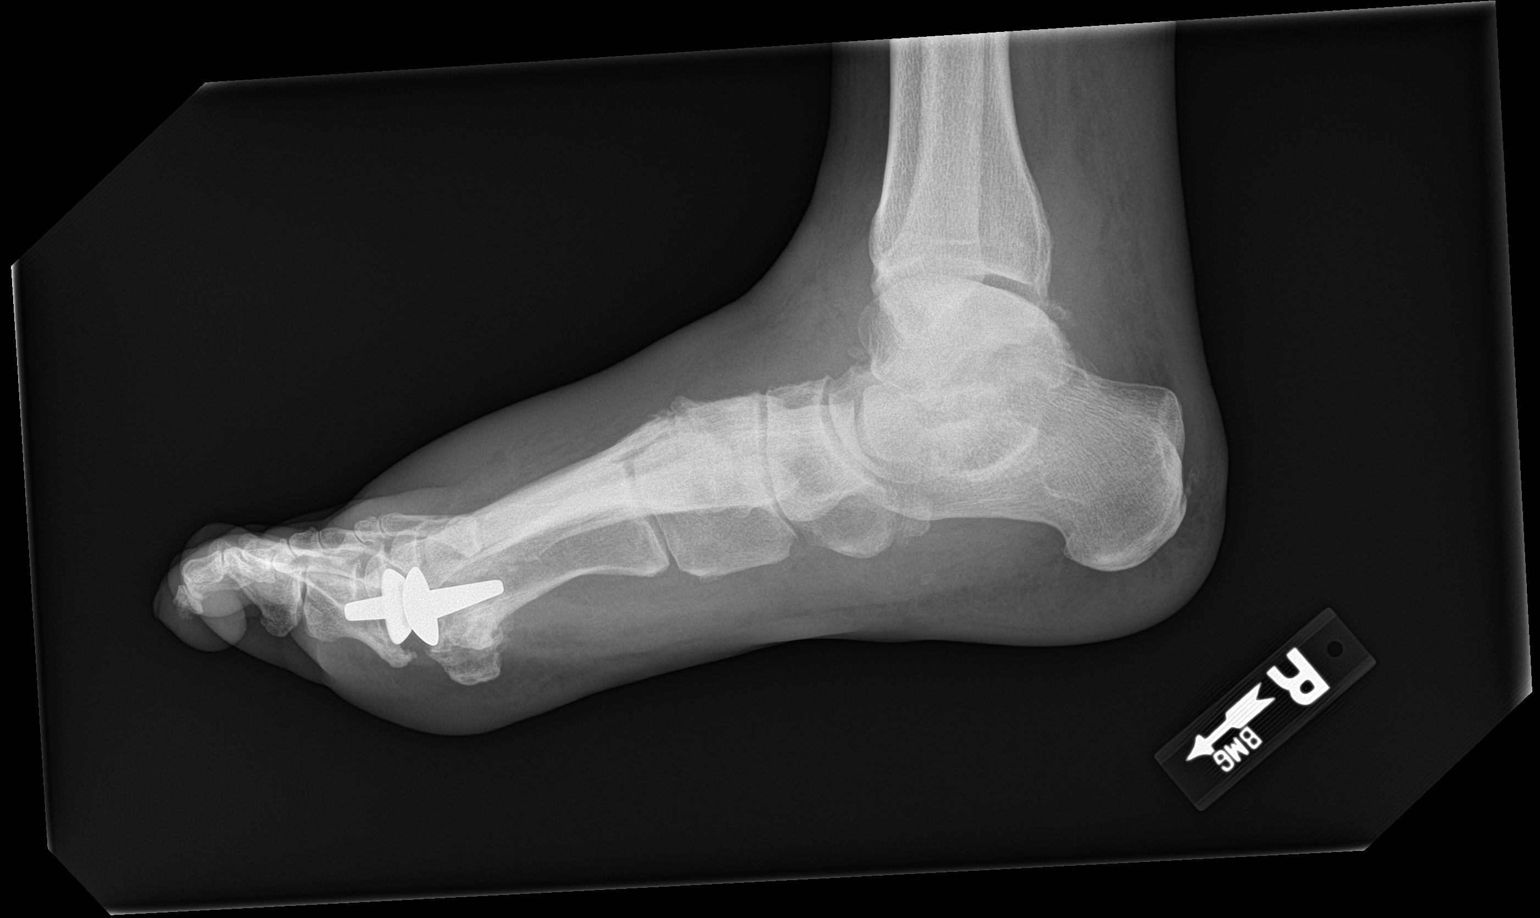

[3 of 3 positions shown; findings below may reference images not displayed]

FINDINGS: The patient is status post joint replacement at the first MTP joint.
Lucency around the hardware suggests loosening, unchanged since the
comparison study. There is also erosive change in the distal second
phalanx, also unchanged. Diffuse soft tissue swelling. No fractures
or other bony abnormalities identified.
IMPRESSION: 1. Diffuse soft tissue swelling.  No acute fracture identified.
2. First MTP joint replacement. Lucency around the hardware
consistent with loosening.
3. Erosive changes of the distal second phalanx, unchanged.
